# Patient Record
Sex: Female | Born: 1960 | Race: White | Hispanic: No | Marital: Married | Smoking: Former smoker
Health system: Southern US, Community
[De-identification: ages and names within clinical notes are randomized; demographics above are authoritative.]

## PROBLEM LIST (undated history)

## (undated) DIAGNOSIS — Z9889 Other specified postprocedural states: Secondary | ICD-10-CM

## (undated) DIAGNOSIS — E785 Hyperlipidemia, unspecified: Secondary | ICD-10-CM

## (undated) DIAGNOSIS — E042 Nontoxic multinodular goiter: Secondary | ICD-10-CM

## (undated) DIAGNOSIS — R4586 Emotional lability: Secondary | ICD-10-CM

## (undated) DIAGNOSIS — R112 Nausea with vomiting, unspecified: Secondary | ICD-10-CM

## (undated) DIAGNOSIS — F32A Depression, unspecified: Secondary | ICD-10-CM

## (undated) DIAGNOSIS — M1990 Unspecified osteoarthritis, unspecified site: Secondary | ICD-10-CM

## (undated) DIAGNOSIS — G4730 Sleep apnea, unspecified: Secondary | ICD-10-CM

## (undated) DIAGNOSIS — Z974 Presence of external hearing-aid: Secondary | ICD-10-CM

## (undated) DIAGNOSIS — K219 Gastro-esophageal reflux disease without esophagitis: Secondary | ICD-10-CM

## (undated) DIAGNOSIS — C801 Malignant (primary) neoplasm, unspecified: Secondary | ICD-10-CM

## (undated) HISTORY — PX: ABDOMINAL HYSTERECTOMY: SHX81

## (undated) HISTORY — PX: BUNIONECTOMY: SHX129

## (undated) HISTORY — PX: BLADDER SUSPENSION: SHX72

## (undated) HISTORY — PX: COLONOSCOPY WITH ESOPHAGOGASTRODUODENOSCOPY (EGD): SHX5779

---
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
## 2005-01-12 ENCOUNTER — Inpatient Hospital Stay: Payer: Self-pay | Admitting: Urology

## 2005-03-16 ENCOUNTER — Ambulatory Visit: Payer: Self-pay | Admitting: Family Medicine

## 2007-01-06 ENCOUNTER — Ambulatory Visit: Payer: Self-pay

## 2007-02-07 ENCOUNTER — Ambulatory Visit: Payer: Self-pay | Admitting: Family Medicine

## 2008-02-28 ENCOUNTER — Ambulatory Visit: Payer: Self-pay | Admitting: Family Medicine

## 2009-04-23 ENCOUNTER — Ambulatory Visit: Payer: Self-pay | Admitting: Family Medicine

## 2010-09-02 ENCOUNTER — Ambulatory Visit: Payer: Self-pay | Admitting: Family Medicine

## 2011-09-21 ENCOUNTER — Ambulatory Visit: Payer: Self-pay | Admitting: Family Medicine

## 2011-10-25 ENCOUNTER — Ambulatory Visit: Payer: Self-pay | Admitting: Gastroenterology

## 2011-10-26 LAB — PATHOLOGY REPORT

## 2012-11-22 ENCOUNTER — Ambulatory Visit: Payer: Self-pay | Admitting: Family Medicine

## 2014-01-10 ENCOUNTER — Ambulatory Visit: Payer: Self-pay | Admitting: Family Medicine

## 2015-01-14 ENCOUNTER — Ambulatory Visit
Admit: 2015-01-14 | Disposition: A | Discharge status: Home / Self Care | Payer: Self-pay | Attending: Family Medicine | Admitting: Family Medicine

## 2016-01-27 ENCOUNTER — Other Ambulatory Visit: Payer: Self-pay | Admitting: Family Medicine

## 2016-01-27 DIAGNOSIS — Z1231 Encounter for screening mammogram for malignant neoplasm of breast: Secondary | ICD-10-CM

## 2016-02-03 ENCOUNTER — Ambulatory Visit
Admission: RE | Admit: 2016-02-03 | Discharge: 2016-02-03 | Disposition: A | Discharge status: Home / Self Care | Payer: PRIVATE HEALTH INSURANCE | Source: Ambulatory Visit | Attending: Physician Assistant | Admitting: Physician Assistant

## 2016-02-03 ENCOUNTER — Other Ambulatory Visit: Payer: Self-pay | Admitting: Physician Assistant

## 2016-02-03 DIAGNOSIS — R1032 Left lower quadrant pain: Secondary | ICD-10-CM | POA: Insufficient documentation

## 2016-02-03 DIAGNOSIS — K449 Diaphragmatic hernia without obstruction or gangrene: Secondary | ICD-10-CM | POA: Diagnosis not present

## 2016-02-03 DIAGNOSIS — Z90710 Acquired absence of both cervix and uterus: Secondary | ICD-10-CM | POA: Diagnosis not present

## 2016-02-03 DIAGNOSIS — K7689 Other specified diseases of liver: Secondary | ICD-10-CM | POA: Diagnosis not present

## 2016-02-03 HISTORY — DX: Malignant (primary) neoplasm, unspecified: C80.1

## 2016-02-03 MED ORDER — IOPAMIDOL (ISOVUE-300) INJECTION 61%
100.0000 mL | Freq: Once | INTRAVENOUS | Status: AC | PRN
  Administered 2016-02-03: 100 mL via INTRAVENOUS

## 2016-02-13 ENCOUNTER — Ambulatory Visit: Payer: Self-pay

## 2016-04-02 ENCOUNTER — Ambulatory Visit
Admission: RE | Admit: 2016-04-02 | Discharge: 2016-04-02 | Disposition: A | Discharge status: Home / Self Care | Payer: PRIVATE HEALTH INSURANCE | Source: Ambulatory Visit | Attending: Family Medicine | Admitting: Family Medicine

## 2016-04-02 DIAGNOSIS — Z1231 Encounter for screening mammogram for malignant neoplasm of breast: Secondary | ICD-10-CM | POA: Insufficient documentation

## 2017-03-10 ENCOUNTER — Other Ambulatory Visit: Payer: Self-pay | Admitting: Student

## 2017-03-10 DIAGNOSIS — M25562 Pain in left knee: Secondary | ICD-10-CM

## 2017-03-16 ENCOUNTER — Ambulatory Visit
Admission: RE | Admit: 2017-03-16 | Discharge: 2017-03-16 | Disposition: A | Discharge status: Home / Self Care | Payer: PRIVATE HEALTH INSURANCE | Source: Ambulatory Visit | Attending: Student | Admitting: Student

## 2017-03-16 DIAGNOSIS — M25462 Effusion, left knee: Secondary | ICD-10-CM | POA: Insufficient documentation

## 2017-03-16 DIAGNOSIS — M25562 Pain in left knee: Secondary | ICD-10-CM | POA: Diagnosis present

## 2017-05-03 ENCOUNTER — Encounter: Payer: Self-pay | Admitting: *Deleted

## 2017-05-04 ENCOUNTER — Ambulatory Visit
Admission: RE | Admit: 2017-05-04 | Discharge: 2017-05-04 | Disposition: A | Discharge status: Home / Self Care | Payer: PRIVATE HEALTH INSURANCE | Source: Ambulatory Visit | Attending: Surgery | Admitting: Surgery

## 2017-05-04 ENCOUNTER — Encounter
Admission: RE | Disposition: A | Discharge status: Home / Self Care | Payer: Self-pay | Source: Ambulatory Visit | Attending: Surgery

## 2017-05-04 ENCOUNTER — Ambulatory Visit: Payer: PRIVATE HEALTH INSURANCE | Admitting: Anesthesiology

## 2017-05-04 DIAGNOSIS — E785 Hyperlipidemia, unspecified: Secondary | ICD-10-CM | POA: Diagnosis not present

## 2017-05-04 DIAGNOSIS — F329 Major depressive disorder, single episode, unspecified: Secondary | ICD-10-CM | POA: Diagnosis not present

## 2017-05-04 DIAGNOSIS — Z79899 Other long term (current) drug therapy: Secondary | ICD-10-CM | POA: Insufficient documentation

## 2017-05-04 DIAGNOSIS — Z888 Allergy status to other drugs, medicaments and biological substances status: Secondary | ICD-10-CM | POA: Diagnosis not present

## 2017-05-04 DIAGNOSIS — Z87891 Personal history of nicotine dependence: Secondary | ICD-10-CM | POA: Insufficient documentation

## 2017-05-04 DIAGNOSIS — M23341 Other meniscus derangements, anterior horn of lateral meniscus, right knee: Secondary | ICD-10-CM | POA: Insufficient documentation

## 2017-05-04 DIAGNOSIS — M1711 Unilateral primary osteoarthritis, right knee: Secondary | ICD-10-CM | POA: Insufficient documentation

## 2017-05-04 DIAGNOSIS — M23331 Other meniscus derangements, other medial meniscus, right knee: Secondary | ICD-10-CM | POA: Insufficient documentation

## 2017-05-04 DIAGNOSIS — S83229A Peripheral tear of medial meniscus, current injury, unspecified knee, initial encounter: Secondary | ICD-10-CM | POA: Diagnosis present

## 2017-05-04 HISTORY — DX: Nausea with vomiting, unspecified: R11.2

## 2017-05-04 HISTORY — DX: Presence of external hearing-aid: Z97.4

## 2017-05-04 HISTORY — PX: KNEE ARTHROSCOPY WITH MEDIAL MENISECTOMY: SHX5651

## 2017-05-04 HISTORY — DX: Gastro-esophageal reflux disease without esophagitis: K21.9

## 2017-05-04 HISTORY — DX: Other specified postprocedural states: Z98.890

## 2017-05-04 SURGERY — ARTHROSCOPY, KNEE, WITH MEDIAL MENISCECTOMY
Anesthesia: General | Laterality: Left

## 2017-05-04 MED ORDER — CEFAZOLIN SODIUM-DEXTROSE 2-4 GM/100ML-% IV SOLN
2.0000 g | Freq: Once | INTRAVENOUS | Status: AC
  Administered 2017-05-04: 2 g via INTRAVENOUS

## 2017-05-04 MED ORDER — MIDAZOLAM HCL 5 MG/5ML IJ SOLN
INTRAMUSCULAR | Status: DC | PRN
  Administered 2017-05-04: 2 mg via INTRAVENOUS

## 2017-05-04 MED ORDER — OXYCODONE HCL 5 MG PO TABS: 5.0000 mg | ORAL_TABLET | Freq: Once | ORAL | Status: DC | PRN

## 2017-05-04 MED ORDER — ONDANSETRON HCL 4 MG/2ML IJ SOLN
INTRAMUSCULAR | Status: DC | PRN
  Administered 2017-05-04: 4 mg via INTRAVENOUS

## 2017-05-04 MED ORDER — FENTANYL CITRATE (PF) 100 MCG/2ML IJ SOLN: 25.0000 ug | INTRAMUSCULAR | Status: DC | PRN

## 2017-05-04 MED ORDER — HYDROCODONE-ACETAMINOPHEN 5-325 MG PO TABS: 1.0000 | ORAL_TABLET | Freq: Four times a day (QID) | ORAL | 0 refills | Status: DC | PRN

## 2017-05-04 MED ORDER — DEXAMETHASONE SODIUM PHOSPHATE 4 MG/ML IJ SOLN
INTRAMUSCULAR | Status: DC | PRN
  Administered 2017-05-04: 4 mg via INTRAVENOUS

## 2017-05-04 MED ORDER — FENTANYL CITRATE (PF) 100 MCG/2ML IJ SOLN
INTRAMUSCULAR | Status: DC | PRN
  Administered 2017-05-04: 50 ug via INTRAVENOUS

## 2017-05-04 MED ORDER — LIDOCAINE-EPINEPHRINE 1 %-1:100000 IJ SOLN
INTRAMUSCULAR | Status: DC | PRN
  Administered 2017-05-04: 20 mL
  Administered 2017-05-04: 30 mL

## 2017-05-04 MED ORDER — LACTATED RINGERS IV SOLN: INTRAVENOUS | Status: DC

## 2017-05-04 MED ORDER — LACTATED RINGERS IV SOLN
INTRAVENOUS | Status: DC
  Administered 2017-05-04: 08:00:00 via INTRAVENOUS

## 2017-05-04 MED ORDER — OXYCODONE HCL 5 MG/5ML PO SOLN: 5.0000 mg | Freq: Once | ORAL | Status: DC | PRN

## 2017-05-04 MED ORDER — ONDANSETRON HCL 4 MG/2ML IJ SOLN: 4.0000 mg | Freq: Once | INTRAMUSCULAR | Status: DC | PRN

## 2017-05-04 MED ORDER — METOCLOPRAMIDE HCL 5 MG PO TABS: 5.0000 mg | ORAL_TABLET | Freq: Three times a day (TID) | ORAL | Status: DC | PRN

## 2017-05-04 MED ORDER — LIDOCAINE HCL (CARDIAC) 20 MG/ML IV SOLN
INTRAVENOUS | Status: DC | PRN
  Administered 2017-05-04: 50 mg via INTRATRACHEAL

## 2017-05-04 MED ORDER — ONDANSETRON HCL 4 MG/2ML IJ SOLN: 4.0000 mg | Freq: Four times a day (QID) | INTRAMUSCULAR | Status: DC | PRN

## 2017-05-04 MED ORDER — METOCLOPRAMIDE HCL 5 MG/ML IJ SOLN: 5.0000 mg | Freq: Three times a day (TID) | INTRAMUSCULAR | Status: DC | PRN

## 2017-05-04 MED ORDER — POTASSIUM CHLORIDE IN NACL 20-0.9 MEQ/L-% IV SOLN: INTRAVENOUS | Status: DC

## 2017-05-04 MED ORDER — PROPOFOL 10 MG/ML IV BOLUS
INTRAVENOUS | Status: DC | PRN
  Administered 2017-05-04: 150 mg via INTRAVENOUS

## 2017-05-04 MED ORDER — ACETAMINOPHEN 10 MG/ML IV SOLN: 1000.0000 mg | Freq: Once | INTRAVENOUS | Status: DC | PRN

## 2017-05-04 MED ORDER — EPHEDRINE SULFATE 50 MG/ML IJ SOLN
INTRAMUSCULAR | Status: DC | PRN
  Administered 2017-05-04: 10 mg via INTRAVENOUS
  Administered 2017-05-04: 5 mg via INTRAVENOUS

## 2017-05-04 MED ORDER — GLYCOPYRROLATE 0.2 MG/ML IJ SOLN
INTRAMUSCULAR | Status: DC | PRN
  Administered 2017-05-04: 0.1 mg via INTRAVENOUS

## 2017-05-04 MED ORDER — ONDANSETRON HCL 4 MG PO TABS: 4.0000 mg | ORAL_TABLET | Freq: Four times a day (QID) | ORAL | Status: DC | PRN

## 2017-05-04 MED ORDER — ROPIVACAINE HCL 5 MG/ML IJ SOLN
INTRAMUSCULAR | Status: DC | PRN
  Administered 2017-05-04: 30 mL

## 2017-05-04 MED ORDER — HYDROCODONE-ACETAMINOPHEN 5-325 MG PO TABS: 1.0000 | ORAL_TABLET | ORAL | Status: DC | PRN

## 2017-05-04 SURGICAL SUPPLY — 33 items
BANDAGE ELASTIC 6 LF NS (GAUZE/BANDAGES/DRESSINGS) ×2 IMPLANT
BLADE FULL RADIUS 3.5 (BLADE) ×2 IMPLANT
BUR ACROMIONIZER 4.0 (BURR) IMPLANT
CHLORAPREP W/TINT 26ML (MISCELLANEOUS) ×2 IMPLANT
COVER LIGHT HANDLE UNIVERSAL (MISCELLANEOUS) ×4 IMPLANT
CUFF TOURN SGL QUICK 30 (MISCELLANEOUS)
CUFF TOURN SGL QUICK 34 (TOURNIQUET CUFF) ×1
CUFF TRNQT CYL 34X4X40X1 (TOURNIQUET CUFF) ×1 IMPLANT
CUFF TRNQT CYL LO 30X4X (MISCELLANEOUS) IMPLANT
DRAPE IMP U-DRAPE 54X76 (DRAPES) ×2 IMPLANT
FASTFIX NDL DEL SYS 360 CVD (Miscellaneous) ×4 IMPLANT
GAUZE SPONGE 4X4 12PLY STRL (GAUZE/BANDAGES/DRESSINGS) ×2 IMPLANT
GLOVE BIO SURGEON STRL SZ8 (GLOVE) ×4 IMPLANT
GLOVE INDICATOR 8.0 STRL GRN (GLOVE) ×2 IMPLANT
GOWN STRL REUS W/ TWL LRG LVL3 (GOWN DISPOSABLE) ×1 IMPLANT
GOWN STRL REUS W/ TWL XL LVL3 (GOWN DISPOSABLE) ×1 IMPLANT
GOWN STRL REUS W/TWL LRG LVL3 (GOWN DISPOSABLE) ×1
GOWN STRL REUS W/TWL XL LVL3 (GOWN DISPOSABLE) ×1
IV LACTATED RINGER IRRG 3000ML (IV SOLUTION) ×1
IV LR IRRIG 3000ML ARTHROMATIC (IV SOLUTION) ×1 IMPLANT
KIT ROOM TURNOVER OR (KITS) ×2 IMPLANT
MANIFOLD 4PT FOR NEPTUNE1 (MISCELLANEOUS) ×2 IMPLANT
NEEDLE HYPO 21X1.5 SAFETY (NEEDLE) ×4 IMPLANT
PACK ARTHROSCOPY KNEE (MISCELLANEOUS) ×2 IMPLANT
PUSHER KNOT ARTHRO STRT FASTFI (MISCELLANEOUS) ×2 IMPLANT
STRAP BODY AND KNEE 60X3 (MISCELLANEOUS) ×2 IMPLANT
SUT PROLENE 4 0 PS 2 18 (SUTURE) ×2 IMPLANT
SUT VIC AB 2-0 CT1 27 (SUTURE)
SUT VIC AB 2-0 CT1 TAPERPNT 27 (SUTURE) IMPLANT
SYR 50ML LL SCALE MARK (SYRINGE) ×2 IMPLANT
SYSTEM NDL DEL FSTFX  360 CVD (Miscellaneous) ×2 IMPLANT
TUBING ARTHRO INFLOW-ONLY STRL (TUBING) ×2 IMPLANT
WAND HAND CNTRL MULTIVAC 90 (MISCELLANEOUS) IMPLANT

## 2017-05-04 NOTE — Transfer of Care (Signed)
Immediate Anesthesia Transfer of Care Note  Patient: Isabella Luna  Procedure(s) Performed: Procedure(s): KNEE ARTHROSCOPY WITH DEBRIDEMENT AND PARTIAL MEDIAL MENISECTOMY (Left)  Patient Location: PACU  Anesthesia Type: General  Level of Consciousness: awake, alert  and patient cooperative  Airway and Oxygen Therapy: Patient Spontanous Breathing and Patient connected to supplemental oxygen  Post-op Assessment: Post-op Vital signs reviewed, Patient's Cardiovascular Status Stable, Respiratory Function Stable, Patent Airway and No signs of Nausea or vomiting  Post-op Vital Signs: Reviewed and stable  Complications: No apparent anesthesia complications

## 2017-05-04 NOTE — Anesthesia Postprocedure Evaluation (Signed)
Anesthesia Post Note  Patient: Isabella Luna  Procedure(s) Performed: Procedure(s) (LRB): Arthroscopic medial meniscus repair and arthroscopic partial lateral meniscectomy, right knee.  (Left)  Patient location during evaluation: PACU Anesthesia Type: General Level of consciousness: awake and alert, oriented and patient cooperative Pain management: pain level controlled Vital Signs Assessment: post-procedure vital signs reviewed and stable Respiratory status: spontaneous breathing, nonlabored ventilation and respiratory function stable Cardiovascular status: blood pressure returned to baseline and stable Postop Assessment: adequate PO intake Anesthetic complications: no    Darrin Nipper

## 2017-05-04 NOTE — H&P (Signed)
Paper H&P to be scanned into permanent record. H&P reviewed and patient re-examined. No changes. 

## 2017-05-04 NOTE — Anesthesia Preprocedure Evaluation (Signed)
Anesthesia Evaluation  Patient identified by MRN, date of birth, ID band Patient awake    Reviewed: Allergy & Precautions, NPO status , Patient's Chart, lab work & pertinent test results  History of Anesthesia Complications (+) PONV and history of anesthetic complications  Airway Mallampati: IV  TM Distance: >3 FB Neck ROM: Full    Dental no notable dental hx. (+) Teeth Intact   Pulmonary former smoker (quit 1993),  Snoring    Pulmonary exam normal breath sounds clear to auscultation       Cardiovascular Exercise Tolerance: Good negative cardio ROS Normal cardiovascular exam Rhythm:Regular Rate:Normal     Neuro/Psych negative neurological ROS     GI/Hepatic GERD  Controlled,  Endo/Other  negative endocrine ROS  Renal/GU negative Renal ROS     Musculoskeletal   Abdominal   Peds  Hematology negative hematology ROS (+)   Anesthesia Other Findings   Reproductive/Obstetrics                             Anesthesia Physical Anesthesia Plan  ASA: II  Anesthesia Plan: General   Post-op Pain Management:    Induction: Intravenous  PONV Risk Score and Plan: 2 and Ondansetron and Dexamethasone  Airway Management Planned: LMA  Additional Equipment:   Intra-op Plan:   Post-operative Plan: Extubation in OR  Informed Consent: I have reviewed the patients History and Physical, chart, labs and discussed the procedure including the risks, benefits and alternatives for the proposed anesthesia with the patient or authorized representative who has indicated his/her understanding and acceptance.     Plan Discussed with: CRNA  Anesthesia Plan Comments:         Anesthesia Quick Evaluation

## 2017-05-04 NOTE — Discharge Instructions (Signed)
General Anesthesia, Adult, Care After °These instructions provide you with information about caring for yourself after your procedure. Your health care provider may also give you more specific instructions. Your treatment has been planned according to current medical practices, but problems sometimes occur. Call your health care provider if you have any problems or questions after your procedure. °What can I expect after the procedure? °After the procedure, it is common to have: °· Vomiting. °· A sore throat. °· Mental slowness. ° °It is common to feel: °· Nauseous. °· Cold or shivery. °· Sleepy. °· Tired. °· Sore or achy, even in parts of your body where you did not have surgery. ° °Follow these instructions at home: °For at least 24 hours after the procedure: °· Do not: °? Participate in activities where you could fall or become injured. °? Drive. °? Use heavy machinery. °? Drink alcohol. °? Take sleeping pills or medicines that cause drowsiness. °? Make important decisions or sign legal documents. °? Take care of children on your own. °· Rest. °Eating and drinking °· If you vomit, drink water, juice, or soup when you can drink without vomiting. °· Drink enough fluid to keep your urine clear or pale yellow. °· Make sure you have little or no nausea before eating solid foods. °· Follow the diet recommended by your health care provider. °General instructions °· Have a responsible adult stay with you until you are awake and alert. °· Return to your normal activities as told by your health care provider. Ask your health care provider what activities are safe for you. °· Take over-the-counter and prescription medicines only as told by your health care provider. °· If you smoke, do not smoke without supervision. °· Keep all follow-up visits as told by your health care provider. This is important. °Contact a health care provider if: °· You continue to have nausea or vomiting at home, and medicines are not helpful. °· You  cannot drink fluids or start eating again. °· You cannot urinate after 8-12 hours. °· You develop a skin rash. °· You have fever. °· You have increasing redness at the site of your procedure. °Get help right away if: °· You have difficulty breathing. °· You have chest pain. °· You have unexpected bleeding. °· You feel that you are having a life-threatening or urgent problem. °This information is not intended to replace advice given to you by your health care provider. Make sure you discuss any questions you have with your health care provider. °Document Released: 12/27/2000 Document Revised: 02/23/2016 Document Reviewed: 09/04/2015 °Elsevier Interactive Patient Education © 2018 [employer] ° ° °Keep dressing dry and intact.  °May shower after dressing changed on post-op day #4 (Sunday).  °Cover sutures with Band-Aids after drying off. °Apply ice frequently to knee. °Take ibuprofen 600-800 mg TID with meals for 7-10 days, then as necessary. °Take pain medication as prescribed or ES Tylenol when needed.  °May weight-bear as tolerated - use crutches or walker as needed. °Follow-up in 10-14 days or as scheduled. °

## 2017-05-04 NOTE — Op Note (Signed)
05/04/2017  10:01 AM  Patient:   Isabella Luna  Pre-Op Diagnosis:   Peripheral tear of medial meniscus, right knee.  Postoperative diagnosis:   Peripheral tear of medial meniscus, degenerative tear of the anterior horn of lateral meniscus, and degenerative joint disease, right knee.  Procedure:   Arthroscopic medial meniscus repair and arthroscopic partial lateral meniscectomy, right knee.  Surgeon:   Pascal Lux, M.D.  Anesthesia:   General LMA.  Findings:   As above. There were grade 3 chondromalacial changes involving the femoral trochlea and grade 1-2 chondromalacia changes involving the patella. The remaining articular surfaces were in satisfactory condition, as were the anterior and posterior cruciate ligaments.  Complications:   None.  EBL:   <5 cc.  Total fluids:   850 cc of crystalloid.  Tourniquet time:   None  Drains:   None  Closure:   4-0 Prolene interrupted sutures.  Brief clinical note:   The patient is  a 56 year old female with a several month history of medial sided right knee pain. Her symptoms have persisted despite medications, activity modification, a steroid injection, etc. Her history and examination are consistent with a medial meniscus tear which was confirmed by MRI scan. The patient presents at this time for arthroscopy, debridement, and repair versus partial medial meniscectomy.  Procedure:   The patient was brought into the operating room and lain in the supine position. After adequate general laryngeal mask anesthesia was obtained, a timeout was performed to verify the appropriate side. The patient's left knee was injected sterilely using a solution of 30 cc of 1% lidocaine and 30 cc of 0.5% Sensorcaine with epinephrine. The left lower extremity was prepped with ChloraPrep solution before being draped sterilely. Preoperative antibiotics were administered. The expected portal sites were injected with 0.5% Sensorcaine with epinephrine before the camera  was placed in the anterolateral portal and instrumentation performed through the anteromedial portal. The knee was sequentially examined beginning in the suprapatellar pouch, then progressing to the patellofemoral space, the medial gutter compartment, the notch, and finally the lateral compartment and gutter. The findings were as described above. Abundant reactive synovial tissues anteriorly were debrided using the full-radius resector in order to improve visualization.  The degenerative tear involving the anterior portion of the lateral meniscus was debrided back to stable margins using the full-radius resector. Medially, the peripheral tear involving the posterior portion of the medial meniscus demonstrated resulted in some instability of the posterior portion. This area was repaired using two [employer] FasT-Fix 360 anchors. Subsequent probing of the repair demonstrated excellent stability. The instruments were removed from the joint after suctioning the excess fluid. The portal sites were closed using 4-0 Prolene interrupted sutures before a sterile bulky dressing was applied to the knee. The patient was then awakened, extubated, and returned to the recovery room in satisfactory condition after tolerating the procedure well.

## 2017-05-04 NOTE — Anesthesia Procedure Notes (Signed)
Procedure Name: LMA Insertion Date/Time: 05/04/2017 9:13 AM Performed by: Mayme Genta Pre-anesthesia Checklist: Patient identified, Emergency Drugs available, Suction available, Timeout performed and Patient being monitored Patient Re-evaluated:Patient Re-evaluated prior to induction Oxygen Delivery Method: Circle system utilized Preoxygenation: Pre-oxygenation with 100% oxygen Induction Type: IV induction LMA: LMA inserted LMA Size: 4.0 Number of attempts: 1 Placement Confirmation: positive ETCO2 and breath sounds checked- equal and bilateral Tube secured with: Tape

## 2017-05-05 ENCOUNTER — Encounter: Payer: Self-pay | Admitting: Surgery

## 2017-05-09 ENCOUNTER — Encounter: Payer: Self-pay | Admitting: Surgery

## 2018-02-22 ENCOUNTER — Encounter
Admission: RE | Admit: 2018-02-22 | Discharge: 2018-02-22 | Disposition: A | Discharge status: Home / Self Care | Payer: PRIVATE HEALTH INSURANCE | Source: Ambulatory Visit | Attending: Surgery | Admitting: Surgery

## 2018-02-22 ENCOUNTER — Ambulatory Visit
Admission: RE | Admit: 2018-02-22 | Discharge: 2018-02-22 | Disposition: A | Discharge status: Home / Self Care | Payer: PRIVATE HEALTH INSURANCE | Source: Ambulatory Visit | Attending: Surgery | Admitting: Surgery

## 2018-02-22 ENCOUNTER — Other Ambulatory Visit: Payer: Self-pay

## 2018-02-22 DIAGNOSIS — Z01810 Encounter for preprocedural cardiovascular examination: Secondary | ICD-10-CM | POA: Diagnosis present

## 2018-02-22 DIAGNOSIS — M1712 Unilateral primary osteoarthritis, left knee: Secondary | ICD-10-CM | POA: Insufficient documentation

## 2018-02-22 DIAGNOSIS — Z85828 Personal history of other malignant neoplasm of skin: Secondary | ICD-10-CM | POA: Insufficient documentation

## 2018-02-22 DIAGNOSIS — J984 Other disorders of lung: Secondary | ICD-10-CM | POA: Insufficient documentation

## 2018-02-22 DIAGNOSIS — Z01818 Encounter for other preprocedural examination: Secondary | ICD-10-CM | POA: Insufficient documentation

## 2018-02-22 DIAGNOSIS — K219 Gastro-esophageal reflux disease without esophagitis: Secondary | ICD-10-CM | POA: Insufficient documentation

## 2018-02-22 DIAGNOSIS — R001 Bradycardia, unspecified: Secondary | ICD-10-CM | POA: Insufficient documentation

## 2018-02-22 DIAGNOSIS — Z01812 Encounter for preprocedural laboratory examination: Secondary | ICD-10-CM | POA: Diagnosis present

## 2018-02-22 HISTORY — DX: Unspecified osteoarthritis, unspecified site: M19.90

## 2018-02-22 HISTORY — DX: Emotional lability: R45.86

## 2018-02-22 LAB — BASIC METABOLIC PANEL
ANION GAP: 5 (ref 5–15)
BUN: 10 mg/dL (ref 6–20)
CALCIUM: 8.7 mg/dL — AB (ref 8.9–10.3)
CO2: 27 mmol/L (ref 22–32)
Chloride: 105 mmol/L (ref 101–111)
Creatinine, Ser: 0.64 mg/dL (ref 0.44–1.00)
GFR calc Af Amer: >60 mL/min (ref >60)
GLUCOSE: 99 mg/dL (ref 65–99)
POTASSIUM: 3.5 mmol/L (ref 3.5–5.1)
Sodium: 137 mmol/L (ref 135–145)

## 2018-02-22 LAB — CBC
HCT: 44.5 % (ref 35.0–47.0)
Hemoglobin: 15.2 g/dL (ref 12.0–16.0)
MCH: 28.9 pg (ref 26.0–34.0)
MCHC: 34.1 g/dL (ref 32.0–36.0)
MCV: 84.9 fL (ref 80.0–100.0)
PLATELETS: 239 10*3/uL (ref 150–440)
RBC: 5.25 MIL/uL — AB (ref 3.80–5.20)
RDW: 12.4 % (ref 11.5–14.5)
WBC: 6.3 10*3/uL (ref 3.6–11.0)

## 2018-02-22 LAB — URINALYSIS, ROUTINE W REFLEX MICROSCOPIC
BACTERIA UA: NONE SEEN
Bilirubin Urine: NEGATIVE
GLUCOSE, UA: NEGATIVE mg/dL
KETONES UR: NEGATIVE mg/dL
Leukocytes, UA: NEGATIVE
NITRITE: NEGATIVE
PROTEIN: NEGATIVE mg/dL
Specific Gravity, Urine: 1.01 (ref 1.005–1.030)
pH: 5 (ref 5.0–8.0)

## 2018-02-22 LAB — PROTIME-INR
INR: 0.93
Prothrombin Time: 12.4 seconds (ref 11.4–15.2)

## 2018-02-22 LAB — TYPE AND SCREEN
ABO/RH(D): A NEG
Antibody Screen: NEGATIVE

## 2018-02-22 LAB — SURGICAL PCR SCREEN
MRSA, PCR: NEGATIVE
STAPHYLOCOCCUS AUREUS: NEGATIVE

## 2018-02-22 NOTE — Patient Instructions (Signed)
Your procedure is scheduled on: 03/07/18 Report to Day Surgery.[facility]OND FLOOR To find out your arrival time please call [phone] between 1PM - 3PM on  03/06/18.  Remember: Instructions that are not followed completely may result in serious medical risk, up to and including death, or upon the discretion of your surgeon and anesthesiologist your surgery may need to be rescheduled.     _X__ 1. Do not eat food after midnight the night before your procedure.                 No gum chewing or hard candies. You may drink clear liquids up to 2 hours                 before you are scheduled to arrive for your surgery- DO not drink clear                 liquids within 2 hours of the start of your surgery.                 Clear Liquids include:  water, apple juice without pulp, clear carbohydrate                 drink such as Clearfast of Gartorade, Black Coffee or Tea (Do not add                 anything to coffee or tea).  __X__2.  On the morning of surgery brush your teeth with toothpaste and water, you                 may rinse your mouth with mouthwash if you wish.  Do not swallow any              toothpaste of mouthwash.     _X__ 3.  No Alcohol for 24 hours before or after surgery.   _X__ 4.  Do Not Smoke or use e-cigarettes For 24 Hours Prior to Your Surgery.                 Do not use any chewable tobacco products for at least 6 hours prior to                 surgery.  ____  5.  Bring all medications with you on the day of surgery if instructed.   _X___  6.  Notify your doctor if there is any change in your medical condition      (cold, fever, infections).     Do not wear jewelry, make-up, hairpins, clips or nail polish. Do not wear lotions, powders, or perfumes. You may wear deodorant. Do not shave 48 hours prior to surgery. Men may shave face and neck. Do not bring valuables to the hospital.    [facility] is not responsible for any belongings or  valuables.  Contacts, dentures or bridgework may not be worn into surgery. Leave your suitcase in the car. After surgery it may be brought to your room. For patients admitted to the hospital, discharge time is determined by your treatment team.   Patients discharged the day of surgery will not be allowed to drive home.   Please read over the following fact sheets that you were given:   Surgical Site Infection Prevention / MRSA  _X___ Take these medicines the morning of surgery with A SIP OF WATER:    1. CITALOPRAM  2.   3.   4.  5.  6.  ____  Fleet Enema (as directed)   _X___ Use CHG Soap as directed  ____ Use inhalers on the day of surgery  ____ Stop metformin 2 days prior to surgery    ____ Take 1/2 of usual insulin dose the night before surgery. No insulin the morning          of surgery.   ____ Stop Coumadin/Plavix/aspirin on   __X__ Stop Anti-inflammatories on    02/22/18 UNTIL AFTER SURGERY  ____ Stop supplements until after surgery.    ____ Bring C-Pap to the hospital.

## 2018-03-06 MED ORDER — CEFAZOLIN SODIUM-DEXTROSE 2-4 GM/100ML-% IV SOLN
2.0000 g | Freq: Once | INTRAVENOUS | Status: AC
  Administered 2018-03-07: 2 g via INTRAVENOUS

## 2018-03-07 ENCOUNTER — Inpatient Hospital Stay: Payer: PRIVATE HEALTH INSURANCE | Admitting: Certified Registered Nurse Anesthetist

## 2018-03-07 ENCOUNTER — Other Ambulatory Visit: Payer: Self-pay

## 2018-03-07 ENCOUNTER — Inpatient Hospital Stay
Admission: RE | Admit: 2018-03-07 | Discharge: 2018-03-09 | DRG: 470 | Disposition: A | Discharge status: Home / Self Care | Payer: PRIVATE HEALTH INSURANCE | Source: Ambulatory Visit | Attending: Surgery | Admitting: Surgery

## 2018-03-07 ENCOUNTER — Encounter: Payer: Self-pay | Admitting: *Deleted

## 2018-03-07 ENCOUNTER — Encounter
Admission: RE | Disposition: A | Discharge status: Home / Self Care | Payer: Self-pay | Source: Ambulatory Visit | Attending: Surgery

## 2018-03-07 ENCOUNTER — Inpatient Hospital Stay: Payer: PRIVATE HEALTH INSURANCE

## 2018-03-07 DIAGNOSIS — Z87891 Personal history of nicotine dependence: Secondary | ICD-10-CM | POA: Diagnosis not present

## 2018-03-07 DIAGNOSIS — E785 Hyperlipidemia, unspecified: Secondary | ICD-10-CM | POA: Diagnosis present

## 2018-03-07 DIAGNOSIS — F329 Major depressive disorder, single episode, unspecified: Secondary | ICD-10-CM | POA: Diagnosis present

## 2018-03-07 DIAGNOSIS — K219 Gastro-esophageal reflux disease without esophagitis: Secondary | ICD-10-CM | POA: Diagnosis present

## 2018-03-07 DIAGNOSIS — M1712 Unilateral primary osteoarthritis, left knee: Principal | ICD-10-CM | POA: Diagnosis present

## 2018-03-07 DIAGNOSIS — Z9189 Other specified personal risk factors, not elsewhere classified: Secondary | ICD-10-CM

## 2018-03-07 DIAGNOSIS — Z96652 Presence of left artificial knee joint: Secondary | ICD-10-CM

## 2018-03-07 DIAGNOSIS — Z888 Allergy status to other drugs, medicaments and biological substances status: Secondary | ICD-10-CM

## 2018-03-07 DIAGNOSIS — Z79899 Other long term (current) drug therapy: Secondary | ICD-10-CM

## 2018-03-07 HISTORY — PX: TOTAL KNEE ARTHROPLASTY: SHX125

## 2018-03-07 LAB — ABO/RH: ABO/RH(D): A NEG

## 2018-03-07 SURGERY — ARTHROPLASTY, KNEE, TOTAL
Anesthesia: Spinal | Site: Knee | Laterality: Left | Wound class: Clean

## 2018-03-07 MED ORDER — BUPIVACAINE-EPINEPHRINE (PF) 0.5% -1:200000 IJ SOLN
INTRAMUSCULAR | Status: DC | PRN
  Administered 2018-03-07: 30 mL via PERINEURAL

## 2018-03-07 MED ORDER — DIPHENHYDRAMINE HCL 12.5 MG/5ML PO ELIX: 12.5000 mg | ORAL_SOLUTION | ORAL | Status: DC | PRN

## 2018-03-07 MED ORDER — SODIUM CHLORIDE 0.9 % IV SOLN
INTRAVENOUS | Status: DC | PRN
  Administered 2018-03-07: 30 ug/min via INTRAVENOUS

## 2018-03-07 MED ORDER — CEFAZOLIN SODIUM-DEXTROSE 2-4 GM/100ML-% IV SOLN
INTRAVENOUS | Status: AC
  Filled 2018-03-07: qty 100

## 2018-03-07 MED ORDER — FLEET ENEMA 7-19 GM/118ML RE ENEM: 1.0000 | ENEMA | Freq: Once | RECTAL | Status: DC | PRN

## 2018-03-07 MED ORDER — ONDANSETRON HCL 4 MG PO TABS: 4.0000 mg | ORAL_TABLET | Freq: Four times a day (QID) | ORAL | Status: DC | PRN

## 2018-03-07 MED ORDER — METOCLOPRAMIDE HCL 10 MG PO TABS: 5.0000 mg | ORAL_TABLET | Freq: Three times a day (TID) | ORAL | Status: DC | PRN

## 2018-03-07 MED ORDER — PANTOPRAZOLE SODIUM 40 MG PO TBEC
40.0000 mg | DELAYED_RELEASE_TABLET | Freq: Every day | ORAL | Status: DC
  Administered 2018-03-08 – 2018-03-09 (×2): 40 mg via ORAL
  Filled 2018-03-07 (×2): qty 1

## 2018-03-07 MED ORDER — BUPIVACAINE HCL (PF) 0.5 % IJ SOLN
INTRAMUSCULAR | Status: DC | PRN
  Administered 2018-03-07: 3 mL

## 2018-03-07 MED ORDER — OXYCODONE HCL 5 MG PO TABS: 5.0000 mg | ORAL_TABLET | Freq: Once | ORAL | Status: DC | PRN

## 2018-03-07 MED ORDER — LIDOCAINE HCL (PF) 2 % IJ SOLN
INTRAMUSCULAR | Status: AC
  Filled 2018-03-07: qty 10

## 2018-03-07 MED ORDER — ACETAMINOPHEN 500 MG PO TABS
1000.0000 mg | ORAL_TABLET | Freq: Four times a day (QID) | ORAL | Status: AC
  Administered 2018-03-07 – 2018-03-08 (×4): 1000 mg via ORAL
  Filled 2018-03-07 (×4): qty 2

## 2018-03-07 MED ORDER — SCOPOLAMINE 1 MG/3DAYS TD PT72
1.0000 | MEDICATED_PATCH | Freq: Once | TRANSDERMAL | Status: DC
  Administered 2018-03-07: 1.5 mg via TRANSDERMAL

## 2018-03-07 MED ORDER — TRANEXAMIC ACID 1000 MG/10ML IV SOLN
INTRAVENOUS | Status: AC
  Filled 2018-03-07: qty 10

## 2018-03-07 MED ORDER — KETOROLAC TROMETHAMINE 30 MG/ML IJ SOLN
30.0000 mg | Freq: Once | INTRAMUSCULAR | Status: AC
  Administered 2018-03-07: 30 mg via INTRAVENOUS

## 2018-03-07 MED ORDER — SODIUM CHLORIDE 0.9 % IV SOLN
INTRAVENOUS | Status: DC | PRN
  Administered 2018-03-07: 60 mL

## 2018-03-07 MED ORDER — GLYCOPYRROLATE 0.2 MG/ML IJ SOLN
INTRAMUSCULAR | Status: AC
  Filled 2018-03-07: qty 1

## 2018-03-07 MED ORDER — BUPIVACAINE HCL (PF) 0.5 % IJ SOLN
INTRAMUSCULAR | Status: AC
  Filled 2018-03-07: qty 10

## 2018-03-07 MED ORDER — PROPOFOL 500 MG/50ML IV EMUL
INTRAVENOUS | Status: AC
  Filled 2018-03-07: qty 50

## 2018-03-07 MED ORDER — ACETAMINOPHEN 325 MG PO TABS
325.0000 mg | ORAL_TABLET | Freq: Four times a day (QID) | ORAL | Status: DC | PRN
  Administered 2018-03-09 (×2): 650 mg via ORAL
  Filled 2018-03-07 (×2): qty 2

## 2018-03-07 MED ORDER — BISACODYL 10 MG RE SUPP: 10.0000 mg | Freq: Every day | RECTAL | Status: DC | PRN

## 2018-03-07 MED ORDER — FENTANYL CITRATE (PF) 100 MCG/2ML IJ SOLN
INTRAMUSCULAR | Status: DC | PRN
  Administered 2018-03-07: 50 ug via INTRAVENOUS

## 2018-03-07 MED ORDER — TRANEXAMIC ACID 1000 MG/10ML IV SOLN
INTRAVENOUS | Status: DC | PRN
  Administered 2018-03-07: 1000 mg via TOPICAL

## 2018-03-07 MED ORDER — PROPOFOL 10 MG/ML IV BOLUS
INTRAVENOUS | Status: DC | PRN
  Administered 2018-03-07 (×2): 30 mg via INTRAVENOUS

## 2018-03-07 MED ORDER — ENOXAPARIN SODIUM 40 MG/0.4ML ~~LOC~~ SOLN
40.0000 mg | SUBCUTANEOUS | Status: DC
  Administered 2018-03-08 – 2018-03-09 (×2): 40 mg via SUBCUTANEOUS
  Filled 2018-03-07 (×2): qty 0.4

## 2018-03-07 MED ORDER — KETOROLAC TROMETHAMINE 15 MG/ML IJ SOLN
15.0000 mg | Freq: Four times a day (QID) | INTRAMUSCULAR | Status: AC
  Administered 2018-03-07 – 2018-03-08 (×4): 15 mg via INTRAVENOUS
  Filled 2018-03-07 (×4): qty 1

## 2018-03-07 MED ORDER — PHENYLEPHRINE HCL 10 MG/ML IJ SOLN
INTRAMUSCULAR | Status: AC
  Filled 2018-03-07: qty 1

## 2018-03-07 MED ORDER — OXYCODONE HCL 5 MG/5ML PO SOLN: 5.0000 mg | Freq: Once | ORAL | Status: DC | PRN

## 2018-03-07 MED ORDER — NEOMYCIN-POLYMYXIN B GU 40-200000 IR SOLN
Status: AC
  Filled 2018-03-07: qty 20

## 2018-03-07 MED ORDER — KETAMINE HCL 50 MG/ML IJ SOLN
INTRAMUSCULAR | Status: DC | PRN
  Administered 2018-03-07: 10 mg via INTRAMUSCULAR
  Administered 2018-03-07: 25 mg via INTRAMUSCULAR

## 2018-03-07 MED ORDER — FENTANYL CITRATE (PF) 100 MCG/2ML IJ SOLN: 25.0000 ug | INTRAMUSCULAR | Status: DC | PRN

## 2018-03-07 MED ORDER — CEFAZOLIN SODIUM-DEXTROSE 2-4 GM/100ML-% IV SOLN
2.0000 g | Freq: Four times a day (QID) | INTRAVENOUS | Status: AC
  Administered 2018-03-07 – 2018-03-08 (×3): 2 g via INTRAVENOUS
  Filled 2018-03-07 (×3): qty 100

## 2018-03-07 MED ORDER — SCOPOLAMINE 1 MG/3DAYS TD PT72
MEDICATED_PATCH | TRANSDERMAL | Status: AC
  Filled 2018-03-07: qty 1

## 2018-03-07 MED ORDER — FENTANYL CITRATE (PF) 100 MCG/2ML IJ SOLN
INTRAMUSCULAR | Status: AC
  Filled 2018-03-07: qty 2

## 2018-03-07 MED ORDER — VALACYCLOVIR HCL 500 MG PO TABS
2000.0000 mg | ORAL_TABLET | Freq: Two times a day (BID) | ORAL | Status: DC | PRN
  Filled 2018-03-07: qty 4

## 2018-03-07 MED ORDER — KETOROLAC TROMETHAMINE 30 MG/ML IJ SOLN
INTRAMUSCULAR | Status: AC
  Administered 2018-03-07: 30 mg via INTRAVENOUS
  Filled 2018-03-07: qty 1

## 2018-03-07 MED ORDER — PSEUDOEPHEDRINE HCL 30 MG PO TABS
30.0000 mg | ORAL_TABLET | ORAL | Status: DC | PRN
Start: 2018-03-07 — End: 2018-03-09
  Filled 2018-03-07: qty 1

## 2018-03-07 MED ORDER — NEOMYCIN-POLYMYXIN B GU 40-200000 IR SOLN
Status: DC | PRN
  Administered 2018-03-07: 14 mL

## 2018-03-07 MED ORDER — METOCLOPRAMIDE HCL 5 MG/ML IJ SOLN: 5.0000 mg | Freq: Three times a day (TID) | INTRAMUSCULAR | Status: DC | PRN

## 2018-03-07 MED ORDER — ONDANSETRON HCL 4 MG/2ML IJ SOLN: 4.0000 mg | Freq: Four times a day (QID) | INTRAMUSCULAR | Status: DC | PRN

## 2018-03-07 MED ORDER — KETAMINE HCL 50 MG/ML IJ SOLN
INTRAMUSCULAR | Status: AC
  Filled 2018-03-07: qty 10

## 2018-03-07 MED ORDER — PROPOFOL 500 MG/50ML IV EMUL
INTRAVENOUS | Status: DC | PRN
  Administered 2018-03-07: 70 ug/kg/min via INTRAVENOUS

## 2018-03-07 MED ORDER — MAGNESIUM HYDROXIDE 400 MG/5ML PO SUSP
30.0000 mL | Freq: Every day | ORAL | Status: DC | PRN
  Filled 2018-03-07: qty 30

## 2018-03-07 MED ORDER — TRAMADOL HCL 50 MG PO TABS: 50.0000 mg | ORAL_TABLET | Freq: Four times a day (QID) | ORAL | Status: DC | PRN

## 2018-03-07 MED ORDER — EPHEDRINE SULFATE 50 MG/ML IJ SOLN
INTRAMUSCULAR | Status: DC | PRN
  Administered 2018-03-07: 10 mg via INTRAVENOUS
  Administered 2018-03-07: 5 mg via INTRAVENOUS
  Administered 2018-03-07: 10 mg via INTRAVENOUS

## 2018-03-07 MED ORDER — SODIUM CHLORIDE 0.9 % IJ SOLN
INTRAMUSCULAR | Status: AC
  Filled 2018-03-07: qty 50

## 2018-03-07 MED ORDER — BUPIVACAINE LIPOSOME 1.3 % IJ SUSP
INTRAMUSCULAR | Status: AC
  Filled 2018-03-07: qty 20

## 2018-03-07 MED ORDER — LACTATED RINGERS IV SOLN
INTRAVENOUS | Status: DC
  Administered 2018-03-07 (×2): via INTRAVENOUS

## 2018-03-07 MED ORDER — FAMOTIDINE 20 MG PO TABS
ORAL_TABLET | ORAL | Status: AC
  Filled 2018-03-07: qty 1

## 2018-03-07 MED ORDER — HYDROMORPHONE HCL 1 MG/ML IJ SOLN: 0.5000 mg | INTRAMUSCULAR | Status: DC | PRN

## 2018-03-07 MED ORDER — BUPIVACAINE-EPINEPHRINE (PF) 0.5% -1:200000 IJ SOLN
INTRAMUSCULAR | Status: AC
  Filled 2018-03-07: qty 30

## 2018-03-07 MED ORDER — MIDAZOLAM HCL 2 MG/2ML IJ SOLN
INTRAMUSCULAR | Status: AC
  Filled 2018-03-07: qty 2

## 2018-03-07 MED ORDER — SIMVASTATIN 20 MG PO TABS
20.0000 mg | ORAL_TABLET | Freq: Every day | ORAL | Status: DC
  Administered 2018-03-07 – 2018-03-08 (×2): 20 mg via ORAL
  Filled 2018-03-07 (×2): qty 1

## 2018-03-07 MED ORDER — DOCUSATE SODIUM 100 MG PO CAPS
100.0000 mg | ORAL_CAPSULE | Freq: Two times a day (BID) | ORAL | Status: DC
  Administered 2018-03-07 – 2018-03-09 (×3): 100 mg via ORAL
  Filled 2018-03-07 (×3): qty 1

## 2018-03-07 MED ORDER — FAMOTIDINE 20 MG PO TABS
20.0000 mg | ORAL_TABLET | Freq: Once | ORAL | Status: AC
  Administered 2018-03-07: 20 mg via ORAL

## 2018-03-07 MED ORDER — CITALOPRAM HYDROBROMIDE 20 MG PO TABS
30.0000 mg | ORAL_TABLET | Freq: Every day | ORAL | Status: DC
  Administered 2018-03-08 – 2018-03-09 (×2): 30 mg via ORAL
  Filled 2018-03-07 (×2): qty 2

## 2018-03-07 MED ORDER — OXYCODONE HCL 5 MG PO TABS
5.0000 mg | ORAL_TABLET | ORAL | Status: DC | PRN
  Administered 2018-03-07 – 2018-03-08 (×3): 5 mg via ORAL
  Filled 2018-03-07: qty 1
  Filled 2018-03-07: qty 2
  Filled 2018-03-07: qty 1

## 2018-03-07 MED ORDER — MIDAZOLAM HCL 5 MG/5ML IJ SOLN
INTRAMUSCULAR | Status: DC | PRN
  Administered 2018-03-07: 2 mg via INTRAVENOUS

## 2018-03-07 MED ORDER — SODIUM CHLORIDE FLUSH 0.9 % IV SOLN
INTRAVENOUS | Status: AC
  Filled 2018-03-07: qty 50

## 2018-03-07 MED ORDER — SODIUM CHLORIDE 0.9 % IV SOLN
INTRAVENOUS | Status: DC
  Administered 2018-03-07: 11:00:00 via INTRAVENOUS

## 2018-03-07 SURGICAL SUPPLY — 57 items
BANDAGE ELASTIC 6 LF NS (GAUZE/BANDAGES/DRESSINGS) ×3 IMPLANT
BEARING TIBIAL AS KNEE 10M 67M (Knees) ×3 IMPLANT
BLADE SAW SAG 25X90X1.19 (BLADE) ×3 IMPLANT
BLADE SURG SZ20 CARB STEEL (BLADE) ×3 IMPLANT
CANISTER SUCT 1200ML W/VALVE (MISCELLANEOUS) ×3 IMPLANT
CANISTER SUCT 3000ML PPV (MISCELLANEOUS) ×3 IMPLANT
CEMENT BONE R 1X40 (Cement) ×6 IMPLANT
CEMENT VACUUM MIXING SYSTEM (MISCELLANEOUS) ×3 IMPLANT
CHLORAPREP W/TINT 26ML (MISCELLANEOUS) ×3 IMPLANT
COOLER POLAR GLACIER W/PUMP (MISCELLANEOUS) ×3 IMPLANT
COVER MAYO STAND STRL (DRAPES) ×3 IMPLANT
CUFF TOURN 24 STER (MISCELLANEOUS) IMPLANT
CUFF TOURN 30 STER DUAL PORT (MISCELLANEOUS) ×3 IMPLANT
DRAPE IMP U-DRAPE 54X76 (DRAPES) ×3 IMPLANT
DRAPE INCISE IOBAN 66X45 STRL (DRAPES) ×3 IMPLANT
DRAPE SHEET LG 3/4 BI-LAMINATE (DRAPES) ×3 IMPLANT
DRSG OPSITE POSTOP 4X10 (GAUZE/BANDAGES/DRESSINGS) ×3 IMPLANT
DRSG OPSITE POSTOP 4X8 (GAUZE/BANDAGES/DRESSINGS) ×3 IMPLANT
ELECT CAUTERY BLADE 6.4 (BLADE) ×3 IMPLANT
ELECT REM PT RETURN 9FT ADLT (ELECTROSURGICAL) ×3
ELECTRODE REM PT RTRN 9FT ADLT (ELECTROSURGICAL) ×1 IMPLANT
FEMORAL CR LEFT 65MM (Joint) ×3 IMPLANT
GLOVE BIO SURGEON STRL SZ7.5 (GLOVE) ×12 IMPLANT
GLOVE BIO SURGEON STRL SZ8 (GLOVE) ×12 IMPLANT
GLOVE BIOGEL PI IND STRL 8 (GLOVE) ×1 IMPLANT
GLOVE BIOGEL PI INDICATOR 8 (GLOVE) ×2
GLOVE INDICATOR 8.0 STRL GRN (GLOVE) ×3 IMPLANT
GOWN STRL REUS W/ TWL LRG LVL3 (GOWN DISPOSABLE) ×1 IMPLANT
GOWN STRL REUS W/ TWL XL LVL3 (GOWN DISPOSABLE) ×1 IMPLANT
GOWN STRL REUS W/TWL LRG LVL3 (GOWN DISPOSABLE) ×2
GOWN STRL REUS W/TWL XL LVL3 (GOWN DISPOSABLE) ×2
HOLDER FOLEY CATH W/STRAP (MISCELLANEOUS) ×3 IMPLANT
HOOD PEEL AWAY FLYTE STAYCOOL (MISCELLANEOUS) ×9 IMPLANT
IMMBOLIZER KNEE 19 BLUE UNIV (SOFTGOODS) ×3 IMPLANT
KIT TURNOVER KIT A (KITS) ×3 IMPLANT
NDL SAFETY ECLIPSE 18X1.5 (NEEDLE) ×2 IMPLANT
NEEDLE HYPO 18GX1.5 SHARP (NEEDLE) ×4
NEEDLE SPNL 20GX3.5 QUINCKE YW (NEEDLE) ×3 IMPLANT
NS IRRIG 1000ML POUR BTL (IV SOLUTION) ×3 IMPLANT
PACK TOTAL KNEE (MISCELLANEOUS) ×3 IMPLANT
PAD WRAPON POLAR KNEE (MISCELLANEOUS) ×1 IMPLANT
PLATE INTERLOK 6700 (Plate) ×3 IMPLANT
PULSAVAC PLUS IRRIG FAN TIP (DISPOSABLE) ×3
SOL .9 NS 3000ML IRR  AL (IV SOLUTION) ×2
SOL .9 NS 3000ML IRR UROMATIC (IV SOLUTION) ×1 IMPLANT
STAPLER SKIN PROX 35W (STAPLE) ×3 IMPLANT
SUCTION FRAZIER HANDLE 10FR (MISCELLANEOUS) ×2
SUCTION TUBE FRAZIER 10FR DISP (MISCELLANEOUS) ×1 IMPLANT
SUT VIC AB 0 CT1 36 (SUTURE) ×9 IMPLANT
SUT VIC AB 2-0 CT1 27 (SUTURE) ×6
SUT VIC AB 2-0 CT1 TAPERPNT 27 (SUTURE) ×3 IMPLANT
SYR 10ML LL (SYRINGE) ×3 IMPLANT
SYR 20CC LL (SYRINGE) ×3 IMPLANT
SYR 30ML LL (SYRINGE) ×9 IMPLANT
TIP FAN IRRIG PULSAVAC PLUS (DISPOSABLE) ×1 IMPLANT
TRAY FOLEY MTR SLVR 16FR STAT (SET/KITS/TRAYS/PACK) ×3 IMPLANT
WRAPON POLAR PAD KNEE (MISCELLANEOUS) ×3

## 2018-03-07 NOTE — Progress Notes (Signed)
Assessment done. Pt awake in bed without c/o . Pleasant and coopertive with care. Call bell in reach, instructed to call for needs.

## 2018-03-07 NOTE — Evaluation (Signed)
Physical Therapy Evaluation Patient Details Name: Isabella Luna MRN: [id number] DOB: 10-29-1960 Today's Date: 03/07/2018   History of Present Illness  Pt admitted for L TKR.  Clinical Impression  Pt is a pleasant 57 year old female who was admitted for L TKR. Pt performs bed mobility, transfers, and ambulation with cga and RW. Educated on [place] status prior to mobility efforts. Pt demonstrates deficits with strength/mobility/ROM/pain. Pt is motivated to perform therapy. Good progress with AROM. Would benefit from skilled PT to address above deficits and promote optimal return to PLOF. Recommend transition to [facility] upon discharge from acute hospitalization. Pt demonstrates ability to perform 10 SLRs with independence, therefore does not require KI for mobility. May possibly be able to progress to OP PT if she tolerates therapy well. Discussed with CM.        Follow Up Recommendations Home health PT    Equipment Recommendations  Rolling walker with 5" wheels;3in1 (PT)    Recommendations for Other Services       Precautions / Restrictions Precautions Precautions: Fall;Knee Precaution Booklet Issued: No Restrictions Weight Bearing Restrictions: Yes LLE Weight Bearing: Weight bearing as tolerated      Mobility  Bed Mobility Overal bed mobility: Needs Assistance Bed Mobility: Supine to Sit     Supine to sit: Min guard     General bed mobility comments: needs slight assist with cues for sequencing and guidance for sliding L LE to floor. Able to follow commands well  Transfers Overall transfer level: Needs assistance Equipment used: Rolling walker (2 wheeled) Transfers: Sit to/from Stand Sit to Stand: Min guard         General transfer comment: safe technique with cues to push from seated surface. Pt educated in proper use of AD. UPright posture noted  Ambulation/Gait Ambulation/Gait assistance: Min guard Ambulation Distance (Feet): 15 Feet Assistive device: Rolling  walker (2 wheeled) Gait Pattern/deviations: Step-to pattern     General Gait Details: ambulated in room with short step to gait pattern. Tends to pick RW up rather than roll on floor. No increased pain noted. No LOB during turns  Financial trader Rankin (Stroke Patients Only)       Balance Overall balance assessment: Needs assistance Sitting-balance support: Feet supported Sitting balance-Leahy Scale: Good     Standing balance support: Bilateral upper extremity supported Standing balance-Leahy Scale: Good                               Pertinent Vitals/Pain Pain Assessment: 0-10 Pain Score: 1  Pain Location: L knee Pain Descriptors / Indicators: Operative site guarding Pain Intervention(s): Limited activity within patient's tolerance;Repositioned;Premedicated before session;Ice applied    Home Living Family/patient expects to be discharged to:: Private residence Living Arrangements: Spouse/significant other;Children(son is able to provide 24/7 assist) Available Help at Discharge: Family Type of Home: House Home Access: Stairs to enter Entrance Stairs-Rails: Left Entrance Stairs-Number of Steps: 4 Home Layout: One level Home Equipment: None      Prior Function Level of Independence: Independent         Comments: working at a bank with desk job. Indep prior to admission, no falls     Hand Dominance        Extremity/Trunk Assessment   Upper Extremity Assessment Upper Extremity Assessment: Overall WFL for tasks assessed    Lower Extremity Assessment Lower Extremity  Assessment: Generalized weakness(L LE grossly 3/5; R LE grossly 4+/5)       Communication   Communication: No difficulties  Cognition Arousal/Alertness: Awake/alert Behavior During Therapy: WFL for tasks assessed/performed Overall Cognitive Status: Within Functional Limits for tasks assessed                                         General Comments      Exercises Total Joint Exercises Goniometric ROM: L knee AROM: 8-68 degrees Other Exercises Other Exercises: supine ther-ex performed on L LE including 10-12 reps of ankle pumps, quad sets, SLRs, and SAQ. All ther-ex performed with cga and cues for sequencing   Assessment/Plan    PT Assessment Patient needs continued PT services  PT Problem List Decreased strength;Decreased range of motion;Decreased balance;Decreased knowledge of use of DME;Pain       PT Treatment Interventions DME instruction;Gait training;Stair training;Therapeutic exercise;Balance training    PT Goals (Current goals can be found in the Care Plan section)  Acute Rehab PT Goals Patient Stated Goal: to do things she wasn't previously able to accomplish PT Goal Formulation: With patient Time For Goal Achievement: 03/21/18 Potential to Achieve Goals: Good    Frequency BID   Barriers to discharge        Co-evaluation               AM-PAC PT "6 Clicks" Daily Activity  Outcome Measure Difficulty turning over in bed (including adjusting bedclothes, sheets and blankets)?: A Little Difficulty moving from lying on back to sitting on the side of the bed? : Unable Difficulty sitting down on and standing up from a chair with arms (e.g., wheelchair, bedside commode, etc,.)?: Unable Help needed moving to and from a bed to chair (including a wheelchair)?: A Little Help needed walking in hospital room?: A Little Help needed climbing 3-5 steps with a railing? : A Lot 6 Click Score: 13    End of Session Equipment Utilized During Treatment: Gait belt Activity Tolerance: Patient tolerated treatment well Patient left: in chair;with chair alarm set;with SCD's reapplied Nurse Communication: Mobility status PT Visit Diagnosis: Muscle weakness (generalized) (M62.81);Pain;Difficulty in walking, not elsewhere classified (R26.2) Pain - Right/Left: Left Pain - part of body: Knee    Time:  9373-4287 PT Time Calculation (min) (ACUTE ONLY): 44 min   Charges:   PT Evaluation $PT Eval Low Complexity: 1 Low PT Treatments $Therapeutic Exercise: 8-22 mins   PT G Codes:        Isabella Luna, PT, DPT [phone]   Isabella Luna 03/07/2018, 4:49 PM

## 2018-03-07 NOTE — Anesthesia Preprocedure Evaluation (Signed)
Anesthesia Evaluation  Patient identified by MRN, date of birth, ID band Patient awake    Reviewed: Allergy & Precautions, H&P , NPO status , Patient's Chart, lab work & pertinent test results  History of Anesthesia Complications (+) PONV and history of anesthetic complications  Airway Mallampati: III  TM Distance: <3 FB Neck ROM: full    Dental  (+) Chipped, Poor Dentition   Pulmonary former smoker,  Signs and symptoms suggestive of sleep apnea            Cardiovascular Exercise Tolerance: Good (-) angina(-) Past MI and (-) DOE negative cardio ROS       Neuro/Psych negative neurological ROS  negative psych ROS   GI/Hepatic Neg liver ROS, GERD  Medicated and Controlled,  Endo/Other  negative endocrine ROS  Renal/GU      Musculoskeletal  (+) Arthritis ,   Abdominal   Peds  Hematology negative hematology ROS (+)   Anesthesia Other Findings Past Medical History: No date: Arthritis No date: Cancer (HCC)     Comment:  skin No date: GERD (gastroesophageal reflux disease) No date: Mood changes No date: PONV (postoperative nausea and vomiting)     Comment:  after Hysterectomy  No date: Wears hearing aid in both ears  Past Surgical History: No date: ABDOMINAL HYSTERECTOMY No date: BLADDER SUSPENSION No date: BUNIONECTOMY; Bilateral No date: COLONOSCOPY WITH ESOPHAGOGASTRODUODENOSCOPY (EGD) 05/04/2017: KNEE ARTHROSCOPY WITH MEDIAL MENISECTOMY; Left     Comment:  Procedure: Arthroscopic medial meniscus repair and               arthroscopic partial lateral meniscectomy, right knee. ;               Surgeon: Corky Mull, MD;  Location: [place];  Service: Orthopedics;  Laterality: Left;  BMI    Body Mass Index:  37.76 kg/m      Reproductive/Obstetrics negative OB ROS                             Anesthesia Physical Anesthesia Plan  ASA: III  Anesthesia  Plan: Spinal   Post-op Pain Management:    Induction:   PONV Risk Score and Plan:   Airway Management Planned: Natural Airway and Nasal Cannula  Additional Equipment:   Intra-op Plan:   Post-operative Plan:   Informed Consent: I have reviewed the patients History and Physical, chart, labs and discussed the procedure including the risks, benefits and alternatives for the proposed anesthesia with the patient or authorized representative who has indicated his/her understanding and acceptance.   Dental Advisory Given  Plan Discussed with: Anesthesiologist, CRNA and Surgeon  Anesthesia Plan Comments: (Patient reports no bleeding problems and no anticoagulant use.  Plan for spinal with backup GA  Patient consented for risks of anesthesia including but not limited to:  - adverse reactions to medications - risk of bleeding, infection, nerve damage and headache - risk of failed spinal - damage to teeth, lips or other oral mucosa - sore throat or hoarseness - Damage to heart, brain, lungs or loss of life  Patient voiced understanding.)        Anesthesia Quick Evaluation

## 2018-03-07 NOTE — NC FL2 (Signed)
[place] LEVEL OF CARE SCREENING TOOL     IDENTIFICATION  Patient Name: FRANCELY CRAW Birthdate: 1961-06-13 Sex: female Admission Date (Current Location): 03/07/2018  [place] and [place] Number:  Engineering geologist and Address:  [facility], [address] [place], [place] [address]      Provider Number: [id number]  Attending Physician Name and Address:  Corky Mull, MD  Relative Name and Phone Number:       Current Level of Care: Hospital Recommended Level of Care: [place] Prior Approval Number:    Date Approved/Denied:   PASRR Number: [id number])  Discharge Plan: SNF    Current Diagnoses: Patient Active Problem List   Diagnosis Date Noted  . Status post total knee replacement using cement, left 03/07/2018    Orientation RESPIRATION BLADDER Height & Weight     Self, Time, Situation, Place  Normal Continent Weight: 220 lb (99.8 kg) Height:  5\' 4"  (162.6 cm)  BEHAVIORAL SYMPTOMS/MOOD NEUROLOGICAL BOWEL NUTRITION STATUS      Continent Diet(Diet: Clear Liquid to be Advanced. )  AMBULATORY STATUS COMMUNICATION OF NEEDS Skin   Extensive Assist Verbally Surgical wounds(Incision: Left Knee. )                       Personal Care Assistance Level of Assistance  Bathing, Feeding, Dressing Bathing Assistance: Limited assistance Feeding assistance: Independent Dressing Assistance: Limited assistance     Functional Limitations Info  Sight, Hearing, Speech Sight Info: Adequate Hearing Info: Adequate Speech Info: Adequate    SPECIAL CARE FACTORS FREQUENCY  PT (By licensed PT), OT (By licensed OT)     PT Frequency: (5) OT Frequency: (5)            Contractures      Additional Factors Info  Code Status, Allergies Code Status Info: (Full Code. ) Allergies Info: (Cortisone)           Current Medications (03/07/2018):  This is the current hospital active medication list Current  Facility-Administered Medications  Medication Dose Route Frequency Provider Last Rate Last Dose  . 0.9 %  sodium chloride infusion   Intravenous Continuous Poggi, Marshall Cork, MD 100 mL/hr at 03/07/18 1105    . acetaminophen (TYLENOL) tablet 1,000 mg  1,000 mg Oral Q6H Poggi, Marshall Cork, MD   1,000 mg at 03/07/18 1225  . [START ON 03/08/2018] acetaminophen (TYLENOL) tablet 325-650 mg  325-650 mg Oral Q6H PRN Poggi, Marshall Cork, MD      . bisacodyl (DULCOLAX) suppository 10 mg  10 mg Rectal Daily PRN Poggi, Marshall Cork, MD      . ceFAZolin (ANCEF) IVPB 2g/100 mL premix  2 g Intravenous Q6H Poggi, Marshall Cork, MD 200 mL/hr at 03/07/18 1441 2 g at 03/07/18 1441  . [START ON 03/08/2018] citalopram (CELEXA) tablet 30 mg  30 mg Oral Daily Poggi, Marshall Cork, MD      . diphenhydrAMINE (BENADRYL) 12.5 MG/5ML elixir 12.5-25 mg  12.5-25 mg Oral Q4H PRN Poggi, Marshall Cork, MD      . docusate sodium (COLACE) capsule 100 mg  100 mg Oral BID Poggi, Marshall Cork, MD      . Derrill Memo ON 03/08/2018] enoxaparin (LOVENOX) injection 40 mg  40 mg Subcutaneous Q24H Poggi, Marshall Cork, MD      . famotidine (PEPCID) 20 MG tablet           . HYDROmorphone (DILAUDID) injection 0.5-1 mg  0.5-1 mg Intravenous Q4H  PRN Poggi, Marshall Cork, MD      . ketorolac (TORADOL) 15 MG/ML injection 15 mg  15 mg Intravenous Q6H Poggi, Marshall Cork, MD   15 mg at 03/07/18 1224  . magnesium hydroxide (MILK OF MAGNESIA) suspension 30 mL  30 mL Oral Daily PRN Poggi, Marshall Cork, MD      . metoCLOPramide (REGLAN) tablet 5-10 mg  5-10 mg Oral Q8H PRN Poggi, Marshall Cork, MD       Or  . metoCLOPramide (REGLAN) injection 5-10 mg  5-10 mg Intravenous Q8H PRN Poggi, Marshall Cork, MD      . ondansetron (ZOFRAN) tablet 4 mg  4 mg Oral Q6H PRN Poggi, Marshall Cork, MD       Or  . ondansetron (ZOFRAN) injection 4 mg  4 mg Intravenous Q6H PRN Poggi, Marshall Cork, MD      . oxyCODONE (Oxy IR/ROXICODONE) immediate release tablet 5-10 mg  5-10 mg Oral Q4H PRN Poggi, Marshall Cork, MD      . pantoprazole (PROTONIX) EC tablet 40 mg  40 mg Oral Daily Poggi,  Marshall Cork, MD      . pseudoephedrine (SUDAFED) tablet 30 mg  30 mg Oral Q4H PRN Poggi, Marshall Cork, MD      . scopolamine (TRANSDERM-SCOP) 1 MG/3DAYS           . simvastatin (ZOCOR) tablet 20 mg  20 mg Oral q1800 Poggi, Marshall Cork, MD      . sodium phosphate (FLEET) 7-19 GM/118ML enema 1 enema  1 enema Rectal Once PRN Poggi, Marshall Cork, MD      . traMADol Veatrice Bourbon) tablet 50 mg  50 mg Oral Q6H PRN Poggi, Marshall Cork, MD      . valACYclovir (VALTREX) tablet 2,000 mg  2,000 mg Oral BID PRN Poggi, Marshall Cork, MD         Discharge Medications: Please see discharge summary for a list of discharge medications.  Relevant Imaging Results:  Relevant Lab Results:   Additional Information (SSN: [id number])  Adabella Stanis, Veronia Beets, LCSW

## 2018-03-07 NOTE — Progress Notes (Signed)
Awakened easily on rounds. cpm machine removed from pt. Call bell in reach. Both heels off bed.

## 2018-03-07 NOTE — Op Note (Signed)
03/07/2018  9:45 AM  Patient:   Isabella Luna  Pre-Op Diagnosis:   Degenerative joint disease, left knee.  Post-Op Diagnosis:   Same  Procedure:   Left TKA using all-cemented [employer] Vanguard system with a 65 mm PCR femur and a 67 mm tibial tray with a 10 mm AS E-poly insert.  Surgeon:   Pascal Lux, MD  Assistant:   Cameron Proud, PA-C   Anesthesia:   Spinal  Findings:   As above  Complications:   None  EBL:   10 cc  Fluids:   1000 cc crystalloid  UOP:   550 cc  TT:   75 minutes at 300 mmHg  Drains:   None  Closure:   Staples  Implants:   As above  Brief Clinical Note:   The patient is a 57 year old female with a long history of progressively worsening left knee pain. The patient's symptoms have progressed despite medications, activity modification, injections, etc. The patient's history and examination were consistent with advanced degenerative joint disease of the right knee confirmed by plain radiographs. The patient presents at this time for a left total knee arthroplasty.  Procedure:   The patient was brought into the operating room. After adequate spinal anesthesia was obtained, the patient was lain in the supine position. A Foley catheter was placed by the nurse before the right lower extremity was prepped with ChloraPrep solution and draped sterilely. Preoperative antibiotics were administered. After verifying the proper laterality with a surgical timeout, the limb was exsanguinated with an Esmarch and the tourniquet inflated to 300 mmHg. A standard anterior approach to the knee was made through an approximately 7 inch incision. The incision was carried down through the subcutaneous tissues to expose superficial retinaculum. This was split the length of the incision and the medial flap elevated sufficiently to expose the medial retinaculum. The medial retinaculum was incised, leaving a 3-4 mm cuff of tissue on the patella. This was extended distally along the medial  border of the patellar tendon and proximally through the medial third of the quadriceps tendon. A subtotal fat pad excision was performed before the soft tissues were elevated off the anteromedial and anterolateral aspects of the proximal tibia to the level of the collateral ligaments. The anterior portions of the medial and lateral menisci were removed, as was the anterior cruciate ligament. With the knee flexed to 90, the external tibial guide was positioned and the appropriate proximal tibial cut made. This piece was taken to the back table where it was measured and found to be optimally replicated by a 67 mm component.  Attention was directed to the distal femur. The intramedullary canal was accessed through a 3/8" drill hole. The intramedullary guide was inserted and position in order to obtain a neutral flexion gap. The intercondylar block was positioned with care taken to avoid notching the anterior cortex of the femur. The appropriate cut was made. Next, the distal cutting block was placed at 6 of valgus alignment. Using the 9 mm slot, the distal cut was made. The distal femur was measured and found to be optimally replicated by the 65 mm component. The 65 mm 4-in-1 cutting block was positioned and first the posterior, then the posterior chamfer, the anterior chamfer, femoral and intercondylar cuts were made. At this point, the posterior portions medial and lateral menisci were removed. A trial reduction was performed using the appropriate femoral and tibial components with the 10 mm insert. This demonstrated excellent stability to varus and  valgus stressing both in flexion and extension while permitting full extension. Patella tracking was assessed and found to be excellent. Therefore, the tibial guide position was marked on the proximal tibia. The patella thickness was measured and found to be 17 mm. Therefore, given that the articular cartilage was in satisfactory condition, it was elected to not  resurface the patella. The lug holes were drilled into the distal femur before the trial component was removed, leaving only the tibial tray. The keel was then created using the appropriate tower, reamer, and punch.  The bony surfaces were prepared for cementing by irrigating thoroughly with bacitracin saline solution. A bone plug was fashioned from some of the bone that had been removed previously and used to plug the distal femoral canal. In addition, 20 cc of Exparel diluted out to 60 cc with normal saline and 30 cc of 0.5% Sensorcaine were injected into the postero-medial and postero-lateral aspects of the knee, the medial and lateral gutter regions, and the peri-incisional tissues to help with postoperative analgesia. Meanwhile, the cement was being mixed on the back table. When it was ready, the tibial tray was cemented in first. The excess cement was removed using [employer] fast streamer. Next, the femoral component was impacted into place. Again, the excess cement was removed using [employer] fast streamer. The 10 mm trial insert was positioned and the knee brought into extension while the cement hardened. Once the cement had hardened, the knee was placed through a range of motion with the findings as described above. Therefore, the trial insert was removed and, after verifying that no cement had been retained posteriorly, the permanent AS 10 mm E-polyethylene insert was positioned and secured using the appropriate key locking mechanism. Again the knee was placed through a range of motion with the findings as described above.  The wound was copiously irrigated with bacitracin saline solution using the jet lavage system before the quadriceps tendon and retinacular layer were reapproximated using #0 Vicryl interrupted sutures. The superficial retinacular layer also was closed using a running #0 Vicryl suture. A total of 10 cc of transexemic acid (TXA) was injected intra-articularly before the subcutaneous tissues were  closed in several layers using 2-0 Vicryl interrupted sutures. The skin was closed using staples. A sterile honeycomb dressing was applied to the skin before the leg was wrapped with an Ace wrap to accommodate the polar pack. The patient was then awakened and returned to the recovery room in satisfactory condition after tolerating the procedure well.

## 2018-03-07 NOTE — Anesthesia Procedure Notes (Signed)
Spinal  Patient location during procedure: OR End time: 03/07/2018 7:35 AM Preanesthetic Checklist Completed: patient identified, site marked, surgical consent, pre-op evaluation, timeout performed, IV checked, risks and benefits discussed and monitors and equipment checked Spinal Block Patient position: sitting Prep: Betadine and ChloraPrep Patient monitoring: heart rate, continuous pulse ox, blood pressure and cardiac monitor Approach: midline Location: L4-5 Injection technique: single-shot Needle Needle type: Introducer and Pencan  Needle gauge: 24 G Needle length: 9 cm Additional Notes Negative paresthesia. Negative blood return. Positive free-flowing CSF. Expiration date of kit checked and confirmed. Patient tolerated procedure well, without complications.

## 2018-03-07 NOTE — Transfer of Care (Signed)
Immediate Anesthesia Transfer of Care Note  Patient: Isabella Luna  Procedure(s) Performed: TOTAL KNEE ARTHROPLASTY (Left Knee)  Patient Location: PACU  Anesthesia Type:Spinal  Level of Consciousness: awake, alert , oriented and patient cooperative  Airway & Oxygen Therapy: Patient Spontanous Breathing and Patient connected to nasal cannula oxygen  Post-op Assessment: Report given to RN and Post -op Vital signs reviewed and stable  Post vital signs: Reviewed and stable  Last Vitals:  Vitals Value Taken Time  BP    Temp    Pulse 96 03/07/2018  9:48 AM  Resp    SpO2 100 % 03/07/2018  9:48 AM  Vitals shown include unvalidated device data.  Last Pain:  Vitals:   03/07/18 0610  TempSrc: Temporal  PainSc: 2          Complications: No apparent anesthesia complications

## 2018-03-07 NOTE — H&P (Signed)
Paper H&P to be scanned into permanent record. H&P reviewed and patient re-examined. No changes. 

## 2018-03-07 NOTE — Anesthesia Post-op Follow-up Note (Signed)
Anesthesia QCDR form completed.        

## 2018-03-08 LAB — BASIC METABOLIC PANEL
ANION GAP: 6 (ref 5–15)
BUN: 7 mg/dL (ref 6–20)
CHLORIDE: 105 mmol/L (ref 101–111)
CO2: 27 mmol/L (ref 22–32)
CREATININE: 0.68 mg/dL (ref 0.44–1.00)
Calcium: 8.5 mg/dL — ABNORMAL LOW (ref 8.9–10.3)
GFR calc non Af Amer: >60 mL/min (ref >60)
Glucose, Bld: 126 mg/dL — ABNORMAL HIGH (ref 65–99)
POTASSIUM: 3.8 mmol/L (ref 3.5–5.1)
Sodium: 138 mmol/L (ref 135–145)

## 2018-03-08 LAB — CBC WITH DIFFERENTIAL/PLATELET
Basophils Absolute: 0 10*3/uL (ref 0–0.1)
Basophils Relative: 1 %
EOS ABS: 0.1 10*3/uL (ref 0–0.7)
EOS PCT: 1 %
HCT: 40.6 % (ref 35.0–47.0)
Hemoglobin: 13.8 g/dL (ref 12.0–16.0)
Lymphocytes Relative: 17 %
Lymphs Abs: 1.1 10*3/uL (ref 1.0–3.6)
MCH: 29.2 pg (ref 26.0–34.0)
MCHC: 33.9 g/dL (ref 32.0–36.0)
MCV: 86.2 fL (ref 80.0–100.0)
MONO ABS: 0.6 10*3/uL (ref 0.2–0.9)
MONOS PCT: 10 %
Neutro Abs: 4.4 10*3/uL (ref 1.4–6.5)
Neutrophils Relative %: 71 %
PLATELETS: 174 10*3/uL (ref 150–440)
RBC: 4.71 MIL/uL (ref 3.80–5.20)
RDW: 12.5 % (ref 11.5–14.5)
WBC: 6.2 10*3/uL (ref 3.6–11.0)

## 2018-03-08 NOTE — Care Management Note (Signed)
Case Management Note  Patient Details  Name: KELCIE CURRIE MRN: [id number] Date of Birth: Feb 03, 1961  Subjective/Objective: Met with patient at bedside to discuss discharge planning. PT recommending outpatient PT. Spoke with Mia [place], [place] who will set up outpatient PT at [facility] clinic and inform patient of date and time.  Pharmacy-[facility] [phone]. Called Lovenox 40 mg # 14 no refills. She will need a walker, Ordered from [place] with [facility].                      Action/Plan:   Expected Discharge Date:  03/09/18               Expected Discharge Plan:  OP Rehab  In-House Referral:     Discharge planning Services  CM Consult  Post Acute Care Choice:  Durable Medical Equipment Choice offered to:  Patient  DME Arranged:  [facility] Rile rolling DME Agency:  [place]:    [facility] Agency:     Status of Service:  In process, will continue to follow  If discussed at Long Length of Stay Meetings, dates discussed:    Additional Comments:  Jolly Mango, RN 03/08/2018, 10:01 AM

## 2018-03-08 NOTE — Progress Notes (Signed)
Sleeping on rounds, resp easy

## 2018-03-08 NOTE — Progress Notes (Signed)
  Subjective: 1 Day Post-Op Procedure(s) (LRB): TOTAL KNEE ARTHROPLASTY (Left) Patient reports pain as mild.   Patient is well, and has had no acute complaints or problems PT and Care management to assist with discharge planning. Negative for chest pain and shortness of breath Fever: no Gastrointestinal:Negative for nausea and vomiting  Objective: Vital signs in last 24 hours: Temp:  [97 F (36.1 C)-98.6 F (37 C)] 98.6 F (37 C) (06/05 0354) Pulse Rate:  [57-84] 58 (06/05 0354) Resp:  [16-19] 16 (06/05 0354) BP: (112-148)/(47-70) 121/49 (06/05 0354) SpO2:  [98 %-100 %] 98 % (06/05 0354)  Intake/Output from previous day:  Intake/Output Summary (Last 24 hours) at 03/08/2018 0750 Last data filed at 03/08/2018 0600 Gross per 24 hour  Intake 4081.67 ml  Output 3960 ml  Net 121.67 ml    Intake/Output this shift: No intake/output data recorded.  Labs: Recent Labs    03/08/18 0442  HGB 13.8   Recent Labs    03/08/18 0442  WBC 6.2  RBC 4.71  HCT 40.6  PLT 174   Recent Labs    03/08/18 0442  NA 138  K 3.8  CL 105  CO2 27  BUN 7  CREATININE 0.68  GLUCOSE 126*  CALCIUM 8.5*   No results for input(s): LABPT, INR in the last 72 hours.   EXAM General - Patient is Alert, Appropriate and Oriented Extremity - ABD soft Sensation intact distally Intact pulses distally Dorsiflexion/Plantar flexion intact Incision: dressing C/D/I No cellulitis present Dressing/Incision - clean, dry, no drainage Motor Function - intact, moving foot and toes well on exam.  Abdomen soft with normal BS this AM.  Past Medical History:  Diagnosis Date  . Arthritis   . Cancer ([place])    skin  . GERD (gastroesophageal reflux disease)   . Mood changes   . PONV (postoperative nausea and vomiting)    after Hysterectomy   . Wears hearing aid in both ears     Assessment/Plan: 1 Day Post-Op Procedure(s) (LRB): TOTAL KNEE ARTHROPLASTY (Left) Active Problems:   Status post total knee  replacement using cement, left  Estimated body mass index is 37.76 kg/m as calculated from the following:   Height as of this encounter: 5\' 4"  (1.626 m).   Weight as of this encounter: 99.8 kg (220 lb). Advance diet Up with therapy D/C IV fluids when tolerating po intake.  Labs reviewed this AM. Minimal pain this AM. Passing gas, will work on BM. Plan will be for discharge home with HHPT tomorrow.  DVT Prophylaxis - Lovenox, Foot Pumps and TED hose Weight-Bearing as tolerated to right leg  J. Cameron Proud, PA-C [facility] Orthopaedic Surgery 03/08/2018, 7:50 AM

## 2018-03-08 NOTE — Progress Notes (Signed)
Assessment doen. Pt awake in bed without c/o. cpm machine on lt leg at 80 degree flexion, pt tolerating well. Rates pain 2/10 and pt states this is a tolerable level for her. Call bell in reach and instructed to call for needs.

## 2018-03-08 NOTE — Progress Notes (Signed)
Foley removed without event.

## 2018-03-08 NOTE — Progress Notes (Signed)
Physical Therapy Treatment Patient Details Name: Isabella Luna MRN: [id number] DOB: [birth year] Today's Date: 03/08/2018    History of Present Illness Pt admitted for L TKR.    PT Comments    Patient continues to progress towards her goals. She demonstrates the ability to ambulate increasing distances with RW without any near falls or LOB. Patient requires only min verbal cuing for HEP. Patients pain remains to be well controlled with pain medication upon request. Patient states that she has had a bowel movement and is progressing toward discharge with a PT recommendation of home with outpatient physical therapy. PT will continue to see pt BID while she is admitted. PT plans to attempt stair training at next visit. Written HEP given and reviewed.    Follow Up Recommendations  Outpatient PT     Equipment Recommendations  Rolling walker with 5" wheels;3in1 (PT)    Recommendations for Other Services       Precautions / Restrictions Precautions Precautions: Fall;Knee Precaution Booklet Issued: Yes (comment) Restrictions Weight Bearing Restrictions: Yes LLE Weight Bearing: Weight bearing as tolerated    Mobility  Bed Mobility Overal bed mobility: Needs Assistance Bed Mobility: Supine to Sit;Sit to Supine     Supine to sit: Supervision Sit to supine: Supervision   General bed mobility comments: pt performs bed mobility safely with minimal cues and guarding  Transfers Overall transfer level: Needs assistance Equipment used: Rolling walker (2 wheeled) Transfers: Sit to/from Stand Sit to Stand: Min guard         General transfer comment: safe technique with RW, upright posture  Ambulation/Gait Ambulation/Gait assistance: Min guard Ambulation Distance (Feet): 240 Feet Assistive device: Rolling walker (2 wheeled) Gait Pattern/deviations: Step-through pattern Gait velocity: 10' in 15 seconds   General Gait Details: pt ambulated 240' with contact guard assist and minimal  cuing for use of RW. pt demonstrates the ability to navigate enviornment safely with no LOB. pt complains of more than normal fatigue after ambulation   Stairs             Wheelchair Mobility    Modified Rankin (Stroke Patients Only)       Balance Overall balance assessment: Needs assistance Sitting-balance support: Feet supported Sitting balance-Leahy Scale: Good     Standing balance support: Bilateral upper extremity supported Standing balance-Leahy Scale: Good                              Cognition Arousal/Alertness: Awake/alert Behavior During Therapy: WFL for tasks assessed/performed Overall Cognitive Status: Within Functional Limits for tasks assessed                                        Exercises Total Joint Exercises Ankle Circles/Pumps: Left;15 reps Quad Sets: Left;15 reps Gluteal Sets: Left;15 reps Short Arc Quad: Left;Other reps (comment)(12 reps) Hip ABduction/ADduction: Left;Other reps (comment)(12 reps) Straight Leg Raises: Left;Other reps (comment)(12 reps) Long Arc Quad: Left;10 reps    General Comments        Pertinent Vitals/Pain Pain Score: 2  Pain Location: L knee Pain Descriptors / Indicators: Operative site guarding;Aching Pain Intervention(s): Limited activity within patient's tolerance;Monitored during session;Patient requesting pain meds-RN notified;Ice applied    Home Living                      Prior Function  PT Goals (current goals can now be found in the care plan section) Acute Rehab PT Goals Patient Stated Goal: to do things she wasn't previously able to accomplish PT Goal Formulation: With patient Time For Goal Achievement: 03/21/18 Potential to Achieve Goals: Good Progress towards PT goals: Progressing toward goals    Frequency    BID      PT Plan Current plan remains appropriate    Co-evaluation              AM-PAC PT "6 Clicks" Daily Activity   Outcome Measure  Difficulty turning over in bed (including adjusting bedclothes, sheets and blankets)?: None Difficulty moving from lying on back to sitting on the side of the bed? : None Difficulty sitting down on and standing up from a chair with arms (e.g., wheelchair, bedside commode, etc,.)?: Unable Help needed moving to and from a bed to chair (including a wheelchair)?: A Little Help needed walking in hospital room?: None Help needed climbing 3-5 steps with a railing? : A Little 6 Click Score: 19    End of Session Equipment Utilized During Treatment: Gait belt Activity Tolerance: Patient tolerated treatment well Patient left: in bed;with bed alarm set Nurse Communication: Patient requests pain meds PT Visit Diagnosis: Muscle weakness (generalized) (M62.81);Pain;Difficulty in walking, not elsewhere classified (R26.2) Pain - Right/Left: Left Pain - part of body: Knee     Time: 5643-3295 PT Time Calculation (min) (ACUTE ONLY): 30 min  Charges:                       G Codes:       Rozell Theiler Marylu Lund, SPT   Lassie Demorest 03/08/2018, 3:33 PM

## 2018-03-08 NOTE — Progress Notes (Signed)
No acute events overnight. Slept in long intervals. Vss. Pain control has been good with scheduled pain meds. polor care on. Call bell in reach.

## 2018-03-08 NOTE — Progress Notes (Signed)
Physical Therapy Treatment Patient Details Name: Isabella Luna MRN: [id number] DOB: 01-03-1961 Today's Date: 03/08/2018    History of Present Illness Pt admitted for L TKR.    PT Comments    Pt is making good progress towards goals with improvement in ambulation distance and fluid gait sequencing. Good endurance noted with there-ex and AAROM. Pt able to ambulate 10' in 23 seconds demonstrating slow speed, however no pain noted. Will continue to progress. Pt very eager to participate in therapy. Is wanting to progress straight to OP PT, has transportation. Discussed with CM.    Follow Up Recommendations  Outpatient PT     Equipment Recommendations  Rolling walker with 5" wheels;3in1 (PT)    Recommendations for Other Services       Precautions / Restrictions Precautions Precautions: Fall;Knee Precaution Booklet Issued: No Restrictions Weight Bearing Restrictions: Yes LLE Weight Bearing: Weight bearing as tolerated    Mobility  Bed Mobility Overal bed mobility: Needs Assistance Bed Mobility: Sit to Supine       Sit to supine: Supervision   General bed mobility comments: safe technique with only slight cues needed for sequencing  Transfers Overall transfer level: Needs assistance Equipment used: Rolling walker (2 wheeled) Transfers: Sit to/from Stand Sit to Stand: Min guard         General transfer comment: safe technique with RW, upright posture  Ambulation/Gait Ambulation/Gait assistance: Min guard Ambulation Distance (Feet): 120 Feet Assistive device: Rolling walker (2 wheeled) Gait Pattern/deviations: Step-through pattern     General Gait Details: ambulated in room with step to gait pattern, progressing to reciprocal gait pattern with improved fluid gait pattern. Slow gait speed noted. Needs cues to look forward and L heel strike   Stairs             Wheelchair Mobility    Modified Rankin (Stroke Patients Only)       Balance                                             Cognition Arousal/Alertness: Awake/alert Behavior During Therapy: WFL for tasks assessed/performed Overall Cognitive Status: Within Functional Limits for tasks assessed                                        Exercises Total Joint Exercises Goniometric ROM: L knee AAROM: 4-87 degrees Other Exercises Other Exercises: supine ther-ex performed on L LE including 12 reps of ankle pumps, quad sets, SLRs, hip abd/add and SAQ. All ther-ex performed with cga and cues for sequencing    General Comments        Pertinent Vitals/Pain Pain Assessment: No/denies pain    Home Living                      Prior Function            PT Goals (current goals can now be found in the care plan section) Acute Rehab PT Goals Patient Stated Goal: to do things she wasn't previously able to accomplish PT Goal Formulation: With patient Time For Goal Achievement: 03/21/18 Potential to Achieve Goals: Good Progress towards PT goals: Progressing toward goals    Frequency    BID      PT Plan Discharge plan needs to be  updated    Co-evaluation              AM-PAC PT "6 Clicks" Daily Activity  Outcome Measure  Difficulty turning over in bed (including adjusting bedclothes, sheets and blankets)?: None Difficulty moving from lying on back to sitting on the side of the bed? : None Difficulty sitting down on and standing up from a chair with arms (e.g., wheelchair, bedside commode, etc,.)?: Unable Help needed moving to and from a bed to chair (including a wheelchair)?: A Little Help needed walking in hospital room?: A Little Help needed climbing 3-5 steps with a railing? : A Little 6 Click Score: 18    End of Session Equipment Utilized During Treatment: Gait belt Activity Tolerance: Patient tolerated treatment well Patient left: in bed;with bed alarm set;with SCD's reapplied Nurse Communication: Mobility status PT  Visit Diagnosis: Muscle weakness (generalized) (M62.81);Pain;Difficulty in walking, not elsewhere classified (R26.2) Pain - Right/Left: Left Pain - part of body: Knee     Time: 1610-9604 PT Time Calculation (min) (ACUTE ONLY): 25 min  Charges:  $Gait Training: 8-22 mins $Therapeutic Exercise: 8-22 mins                    G Codes:       Greggory Stallion, PT, DPT [phone]    Isabella Luna 03/08/2018, 11:15 AM

## 2018-03-08 NOTE — Progress Notes (Signed)
Clinical Social Worker (CSW) received SNF consult. PT is recommending outpatient PT. RN case manager aware of above. Please reconsult if future social work needs arise. CSW signing off.   Emalina Dubreuil, LCSW [phone]  

## 2018-03-08 NOTE — Anesthesia Postprocedure Evaluation (Signed)
Anesthesia Post Note  Patient: Isabella Luna  Procedure(s) Performed: TOTAL KNEE ARTHROPLASTY (Left Knee)  Patient location during evaluation: Nursing Unit Anesthesia Type: Spinal Level of consciousness: awake and alert and oriented Pain management: satisfactory to patient Vital Signs Assessment: post-procedure vital signs reviewed and stable Respiratory status: respiratory function stable Cardiovascular status: stable Postop Assessment: no backache, no headache, spinal receding, no apparent nausea or vomiting, patient able to bend at knees, adequate PO intake and able to ambulate Anesthetic complications: no     Last Vitals:  Vitals:   03/07/18 2303 03/08/18 0354  BP: 121/60 (!) 121/49  Pulse: 62 (!) 58  Resp: 19 16  Temp: 37 C 37 C  SpO2: 100% 98%    Last Pain:  Vitals:   03/08/18 0634  TempSrc:   PainSc: 0-No pain                 Blima Singer

## 2018-03-09 LAB — BASIC METABOLIC PANEL
Anion gap: 9 (ref 5–15)
BUN: 9 mg/dL (ref 6–20)
CALCIUM: 9 mg/dL (ref 8.9–10.3)
CO2: 25 mmol/L (ref 22–32)
CREATININE: 0.68 mg/dL (ref 0.44–1.00)
Chloride: 102 mmol/L (ref 101–111)
GFR calc non Af Amer: >60 mL/min (ref >60)
Glucose, Bld: 139 mg/dL — ABNORMAL HIGH (ref 65–99)
Potassium: 3.6 mmol/L (ref 3.5–5.1)
SODIUM: 136 mmol/L (ref 135–145)

## 2018-03-09 LAB — CBC
HCT: 40.7 % (ref 35.0–47.0)
Hemoglobin: 13.9 g/dL (ref 12.0–16.0)
MCH: 29.2 pg (ref 26.0–34.0)
MCHC: 34.1 g/dL (ref 32.0–36.0)
MCV: 85.6 fL (ref 80.0–100.0)
PLATELETS: 191 10*3/uL (ref 150–440)
RBC: 4.76 MIL/uL (ref 3.80–5.20)
RDW: 12.4 % (ref 11.5–14.5)
WBC: 9.7 10*3/uL (ref 3.6–11.0)

## 2018-03-09 MED ORDER — TRAMADOL HCL 50 MG PO TABS: 50.0000 mg | ORAL_TABLET | Freq: Four times a day (QID) | ORAL | 0 refills | Status: DC | PRN

## 2018-03-09 MED ORDER — ENOXAPARIN SODIUM 40 MG/0.4ML ~~LOC~~ SOLN: 40.0000 mg | SUBCUTANEOUS | 0 refills | Status: DC

## 2018-03-09 MED ORDER — OXYCODONE HCL 5 MG PO TABS: 5.0000 mg | ORAL_TABLET | ORAL | 0 refills | Status: DC | PRN

## 2018-03-09 NOTE — Care Management Note (Signed)
Case Management Note  Patient Details  Name: GEARLDINE LOONEY MRN: [id number] Date of Birth: 1961/04/14  Subjective/Objective:  Discharging today with Outpatient PT                 Action/Plan: Cost of Lovenox is $ 94.07. Patient updated and denies issues paying for medication.   Expected Discharge Date:  03/09/18               Expected Discharge Plan:  OP Rehab  In-House Referral:     Discharge planning Services  CM Consult  Post Acute Care Choice:  Durable Medical Equipment Choice offered to:  Patient  DME Arranged:  Gilford Rile rolling DME Agency:  [place]:    [place]:     Status of Service:  Completed, signed off  If discussed at [place] of Stay Meetings, dates discussed:    Additional Comments:  Jolly Mango, RN 03/09/2018, 8:46 AM

## 2018-03-09 NOTE — Discharge Instructions (Signed)

## 2018-03-09 NOTE — Progress Notes (Signed)
Discharge instructions and medication detail reviewed with patient. All questions answered. Patient verbalizes understanding. IV removed. AVS and printed prescriptions given to patient. All patient belongings packed.  Patient was escorted out via wheelchair.

## 2018-03-09 NOTE — Progress Notes (Signed)
Physical Therapy Treatment Patient Details Name: Isabella Luna MRN: [id number] DOB: Jul 02, 1961 Today's Date: 03/09/2018    History of Present Illness Pt admitted for L TKR.    PT Comments    Patient continues to progress towards her goals. Her L knee ROM improved  To 2-90. Patient demonstrates independence with HEP. Patient is able to ambulate ~300 feet total with CGA safely managing RW and her enviornment. Pts gait speed continues to improve, able to amb 10' in 13 seconds. Pt able to ascend and descend 4 stairs using rail on the L safely with CGA after demonstration. Patient has met goals of inpatient rehab and is safe to d/c home with family support and scheduled out patient PT. Pt educated to perform HEP BID, continue use of cryotherapy and maintain extension with towel rolls until outpatient PT.   Follow Up Recommendations  Outpatient PT     Equipment Recommendations  Rolling walker with 5" wheels;3in1 (PT)    Recommendations for Other Services       Precautions / Restrictions Precautions Precautions: Fall;Knee Precaution Booklet Issued: Yes (comment) Restrictions Weight Bearing Restrictions: Yes LLE Weight Bearing: Weight bearing as tolerated    Mobility  Bed Mobility Overal bed mobility: Independent Bed Mobility: Supine to Sit     Supine to sit: Independent     General bed mobility comments: Pt able to independently perform bed mobility safely  Transfers Overall transfer level: Needs assistance Equipment used: Rolling walker (2 wheeled) Transfers: Sit to/from Stand Sit to Stand: Min guard         General transfer comment: safe technique with RW, upright posture  Ambulation/Gait Ambulation/Gait assistance: Min guard Ambulation Distance (Feet): 300 Feet Assistive device: Rolling walker (2 wheeled) Gait Pattern/deviations: Step-through pattern Gait velocity: 10' in 13 seconds   General Gait Details: pt ambulated 300' with contact guard assistance. pt  able to manage RW and enviornment safely   Stairs Stairs: Yes Stairs assistance: Min guard Stair Management: One rail Left Number of Stairs: 4 General stair comments: pt ascends and descends 4 stairs safely with CGA. Demonstration before and cuing for use of hand rail provided   Wheelchair Mobility    Modified Rankin (Stroke Patients Only)       Balance                                            Cognition Arousal/Alertness: Awake/alert Behavior During Therapy: WFL for tasks assessed/performed Overall Cognitive Status: Within Functional Limits for tasks assessed                                        Exercises Total Joint Exercises Goniometric ROM: L knee AAROM 2-90 Other Exercises Other Exercises: Supine ther-ex performed on L LE requiring supervision x 15 reps each: SLR, SAQ, ankle pumps, quad sets, glut sets, heel slides in sitting    General Comments        Pertinent Vitals/Pain Pain Assessment: 0-10 Pain Score: 1  Pain Location: L knee Pain Descriptors / Indicators: Operative site guarding;Aching Pain Intervention(s): Limited activity within patient's tolerance;Monitored during session;Premedicated before session;Ice applied    Home Living                      Prior Function  PT Goals (current goals can now be found in the care plan section) Acute Rehab PT Goals Patient Stated Goal: to do things she wasn't previously able to accomplish PT Goal Formulation: With patient Time For Goal Achievement: 03/21/18 Potential to Achieve Goals: Good Progress towards PT goals: Progressing toward goals    Frequency    BID      PT Plan Current plan remains appropriate    Co-evaluation              AM-PAC PT "6 Clicks" Daily Activity  Outcome Measure  Difficulty turning over in bed (including adjusting bedclothes, sheets and blankets)?: None Difficulty moving from lying on back to sitting on the  side of the bed? : None Difficulty sitting down on and standing up from a chair with arms (e.g., wheelchair, bedside commode, etc,.)?: A Little Help needed moving to and from a bed to chair (including a wheelchair)?: None Help needed walking in hospital room?: A Little Help needed climbing 3-5 steps with a railing? : A Little 6 Click Score: 21    End of Session Equipment Utilized During Treatment: Gait belt Activity Tolerance: Patient tolerated treatment well Patient left: in chair;with chair alarm set Nurse Communication: Other (comment)(Completion of inpatient PT) PT Visit Diagnosis: Muscle weakness (generalized) (M62.81);Pain;Difficulty in walking, not elsewhere classified (R26.2) Pain - Right/Left: Left Pain - part of body: Knee     Time: 4680-3212 PT Time Calculation (min) (ACUTE ONLY): 32 min  Charges:  $Gait Training: 8-22 mins $Therapeutic Exercise: 8-22 mins                    G Codes:       Neziah Braley, SPT   Meghin Thivierge 03/09/2018, 11:10 AM

## 2018-03-09 NOTE — Progress Notes (Signed)
Sleeping on rounds. resp easy. Call bell in reach.

## 2018-03-09 NOTE — Discharge Summary (Signed)
Physician Discharge Summary  Patient ID: Isabella Luna MRN: [id number] DOB/AGE: 56-Sep-1962 56 y.o.  Admit date: 03/07/2018 Discharge date: 03/09/2018  Admission Diagnoses:  primary osteoarthritis of left knee  Discharge Diagnoses: Patient Active Problem List   Diagnosis Date Noted  . Status post total knee replacement using cement, left 03/07/2018    Past Medical History:  Diagnosis Date  . Arthritis   . Cancer ([facility])    skin  . GERD (gastroesophageal reflux disease)   . Mood changes   . PONV (postoperative nausea and vomiting)    after Hysterectomy   . Wears hearing aid in both ears      Transfusion: None.   Consultants (if any):   Discharged Condition: Improved  Hospital Course: Isabella Luna is an 57 y.o. female who was admitted 03/07/2018 with a diagnosis of left knee osteoarthritis and went to the operating room on 03/07/2018 and underwent the above named procedures.    Surgeries: Procedure(s): TOTAL KNEE ARTHROPLASTY on 03/07/2018 Patient tolerated the surgery well. Taken to PACU where she was stabilized and then transferred to the orthopedic floor.  Started on Lovenox 40mg  q 24 hrs. Foot pumps applied bilaterally at 80 mm. Heels elevated on bed with rolled towels. No evidence of DVT. Negative [place]. Physical therapy started on day #1 for gait training and transfer. OT started day #1 for ADL and assisted devices.  Patient's IV was removed on POD1, foley removed shortly after surgery.  Implants: Left TKA using all-cemented [employer] Vanguard system with a 65 mm PCR femur and a 67 mm tibial tray with a 10 mm AS E-poly insert.  She was given perioperative antibiotics:  Anti-infectives (From admission, onward)   Start     Dose/Rate Route Frequency Ordered Stop   03/07/18 1400  ceFAZolin (ANCEF) IVPB 2g/100 mL premix     2 g 200 mL/hr over 30 Minutes Intravenous Every 6 hours 03/07/18 1051 03/08/18 0330   03/07/18 1051  valACYclovir (VALTREX) tablet 2,000 mg     2,000 mg  Oral 2 times daily PRN 03/07/18 1051     03/07/18 0607  ceFAZolin (ANCEF) 2-4 GM/100ML-% IVPB    Note to Pharmacy:  [name]   : cabinet override      03/07/18 0607 03/07/18 0753   03/06/18 2230  ceFAZolin (ANCEF) IVPB 2g/100 mL premix     2 g 200 mL/hr over 30 Minutes Intravenous  Once 03/06/18 2224 03/07/18 0805    .  She was given sequential compression devices, early ambulation, and Lovenox for DVT prophylaxis.  She benefited maximally from the hospital stay and there were no complications.    Recent vital signs:  Vitals:   03/08/18 0750 03/09/18 0028  BP: 126/61 (!) 144/62  Pulse: 66 83  Resp: 20 18  Temp: 98.7 F (37.1 C) 99.2 F (37.3 C)  SpO2: 98% 97%    Recent laboratory studies:  Lab Results  Component Value Date   HGB 13.9 03/09/2018   HGB 13.8 03/08/2018   HGB 15.2 02/22/2018   Lab Results  Component Value Date   WBC 9.7 03/09/2018   PLT 191 03/09/2018   Lab Results  Component Value Date   INR 0.93 02/22/2018   Lab Results  Component Value Date   NA 136 03/09/2018   K 3.6 03/09/2018   CL 102 03/09/2018   CO2 25 03/09/2018   BUN 9 03/09/2018   CREATININE 0.68 03/09/2018   GLUCOSE 139 (H) 03/09/2018    Discharge Medications:  Allergies as of 03/09/2018      Reactions   Cortisone Other (See Comments)   Facial redness, insomnia      Medication List    TAKE these medications   citalopram 20 MG tablet Commonly known as:  CELEXA Take 30 mg by mouth daily.   enoxaparin 40 MG/0.4ML injection Commonly known as:  LOVENOX Inject 0.4 mLs (40 mg total) into the skin daily.   naproxen sodium 220 MG tablet Commonly known as:  ALEVE Take 440 mg by mouth 2 (two) times daily as needed (for pain.).   oxyCODONE 5 MG immediate release tablet Commonly known as:  Oxy IR/ROXICODONE Take 1-2 tablets (5-10 mg total) by mouth every 4 (four) hours as needed for moderate pain.   pseudoephedrine 30 MG tablet Commonly known as:  SUDAFED Take 30 mg by  mouth every 4 (four) hours as needed for congestion.   simvastatin 20 MG tablet Commonly known as:  ZOCOR Take 20 mg by mouth daily.   traMADol 50 MG tablet Commonly known as:  ULTRAM Take 1-2 tablets (50-100 mg total) by mouth every 6 (six) hours as needed for moderate pain.   valACYclovir 1000 MG tablet Commonly known as:  VALTREX Take 2 g by mouth 2 (two) times daily as needed (for cold sores).            Durable Medical Equipment  (From admission, onward)        Start     Ordered   03/07/18 1052  DME Walker rolling  Once    Question:  Patient needs a walker to treat with the following condition  Answer:  Status post total knee replacement using cement, left   03/07/18 1051   03/07/18 1052  DME Bedside commode  Once    Question:  Patient needs a bedside commode to treat with the following condition  Answer:  Status post total knee replacement using cement, left   03/07/18 1051   03/07/18 1052  DME 3 n 1  Once     03/07/18 1051      Diagnostic Studies: Dg Chest 2 View  Result Date: 02/22/2018 CLINICAL DATA:  Preoperative evaluation for LEFT total knee replacement, former smoker EXAM: CHEST - 2 VIEW COMPARISON:  None. FINDINGS: Normal heart size, mediastinal contours, and pulmonary vascularity. Mild peribronchial thickening. Lungs clear. No pleural effusion or pneumothorax. Bones unremarkable. IMPRESSION: Mild bronchitic changes without infiltrate. Electronically Signed   By: Lavonia Dana M.D.   On: 02/22/2018 17:16   Dg Knee Left Port  Result Date: 03/07/2018 CLINICAL DATA:  Status post left total knee joint prosthesis placement. EXAM: PORTABLE LEFT KNEE - 1-2 VIEW COMPARISON:  Preoperative MRI of the knee of March 16, 2017 FINDINGS: The patient has undergone total knee joint prosthesis placement. Radiographic positioning of the prosthetic components is good. The interface with the native bone appears normal. There is a small amount of air in fluid in the anterior aspect of  the joint space. IMPRESSION: No immediate postprocedure complication following left total knee joint prosthesis placement. Electronically Signed   By: David  Martinique M.D.   On: 03/07/2018 10:00   Disposition: Plan is for discharge home this afternoon following PT.  Follow-up Information    Lattie Corns, PA-C Follow up in 14 day(s).   Specialty:  Physician Assistant Why:  Electa Sniff information: [place] [id number] [phone]          Signed: Judson Roch PA-C  03/09/2018, 7:29 AM

## 2018-03-09 NOTE — Progress Notes (Signed)
Assisted by 1 staff member up to br using walker. Pt tolerated activity without event and back to bed. Call bell in reach.

## 2018-03-09 NOTE — Progress Notes (Signed)
  Subjective: 2 Days Post-Op Procedure(s) (LRB): TOTAL KNEE ARTHROPLASTY (Left) Patient reports pain as mild.   Patient is well, and has had no acute complaints or problems Plan is for discharge home from hospital. Negative for chest pain and shortness of breath Fever: no Gastrointestinal:Negative for nausea and vomiting  Objective: Vital signs in last 24 hours: Temp:  [98.7 F (37.1 C)-99.2 F (37.3 C)] 99.2 F (37.3 C) (06/06 0028) Pulse Rate:  [66-83] 83 (06/06 0028) Resp:  [18-20] 18 (06/06 0028) BP: (126-144)/(61-62) 144/62 (06/06 0028) SpO2:  [97 %-98 %] 97 % (06/06 0028)  Intake/Output from previous day:  Intake/Output Summary (Last 24 hours) at 03/09/2018 0725 Last data filed at 03/08/2018 1840 Gross per 24 hour  Intake 1231.67 ml  Output 3 ml  Net 1228.67 ml    Intake/Output this shift: No intake/output data recorded.  Labs: Recent Labs    03/08/18 0442 03/09/18 0444  HGB 13.8 13.9   Recent Labs    03/08/18 0442 03/09/18 0444  WBC 6.2 9.7  RBC 4.71 4.76  HCT 40.6 40.7  PLT 174 191   Recent Labs    03/08/18 0442 03/09/18 0444  NA 138 136  K 3.8 3.6  CL 105 102  CO2 27 25  BUN 7 9  CREATININE 0.68 0.68  GLUCOSE 126* 139*  CALCIUM 8.5* 9.0   No results for input(s): LABPT, INR in the last 72 hours.   EXAM General - Patient is Alert, Appropriate and Oriented Extremity - ABD soft Sensation intact distally Intact pulses distally Dorsiflexion/Plantar flexion intact Incision: dressing C/D/I No cellulitis present Dressing/Incision - clean, dry, no drainage Motor Function - intact, moving foot and toes well on exam.  Abdomen soft with normal BS this AM.  Past Medical History:  Diagnosis Date  . Arthritis   . Cancer ([place])    skin  . GERD (gastroesophageal reflux disease)   . Mood changes   . PONV (postoperative nausea and vomiting)    after Hysterectomy   . Wears hearing aid in both ears     Assessment/Plan: 2 Days Post-Op  Procedure(s) (LRB): TOTAL KNEE ARTHROPLASTY (Left) Active Problems:   Status post total knee replacement using cement, left  Estimated body mass index is 37.76 kg/m as calculated from the following:   Height as of this encounter: 5\' 4"  (1.626 m).   Weight as of this encounter: 99.8 kg (220 lb). Up with therapy   Labs reviewed this AM. Minimal pain this AM. Pt has had a BM. Plan will be for discharge home today following morning session with PT.  DVT Prophylaxis - Lovenox, Foot Pumps and TED hose Weight-Bearing as tolerated to right leg  J. Cameron Proud, PA-C [facility] Orthopaedic Surgery 03/09/2018, 7:25 AM

## 2018-07-05 ENCOUNTER — Other Ambulatory Visit: Payer: Self-pay | Admitting: Family Medicine

## 2018-07-05 DIAGNOSIS — Z1231 Encounter for screening mammogram for malignant neoplasm of breast: Secondary | ICD-10-CM

## 2018-07-14 ENCOUNTER — Ambulatory Visit
Admission: RE | Admit: 2018-07-14 | Discharge: 2018-07-14 | Disposition: A | Discharge status: Home / Self Care | Payer: PRIVATE HEALTH INSURANCE | Source: Ambulatory Visit | Attending: Family Medicine | Admitting: Family Medicine

## 2018-07-14 DIAGNOSIS — Z1231 Encounter for screening mammogram for malignant neoplasm of breast: Secondary | ICD-10-CM | POA: Diagnosis present

## 2018-10-12 ENCOUNTER — Ambulatory Visit: Payer: PRIVATE HEALTH INSURANCE | Attending: Otolaryngology

## 2018-10-12 DIAGNOSIS — G4733 Obstructive sleep apnea (adult) (pediatric): Secondary | ICD-10-CM | POA: Insufficient documentation

## 2018-10-26 ENCOUNTER — Ambulatory Visit: Payer: PRIVATE HEALTH INSURANCE | Attending: Otolaryngology

## 2018-10-26 DIAGNOSIS — G4733 Obstructive sleep apnea (adult) (pediatric): Secondary | ICD-10-CM | POA: Insufficient documentation

## 2019-09-25 IMAGING — CR DG CHEST 2V
1 series · 2 of 2 positions shown · non-contrast
Comparison: None.

CLINICAL DATA: Preoperative evaluation for LEFT total knee
replacement, former smoker

EXAM:
CHEST - 2 VIEW

[Series 1: dg chest 2 view · 0.14mm/px · 2 of 2 slices shown]
[im 1/2]
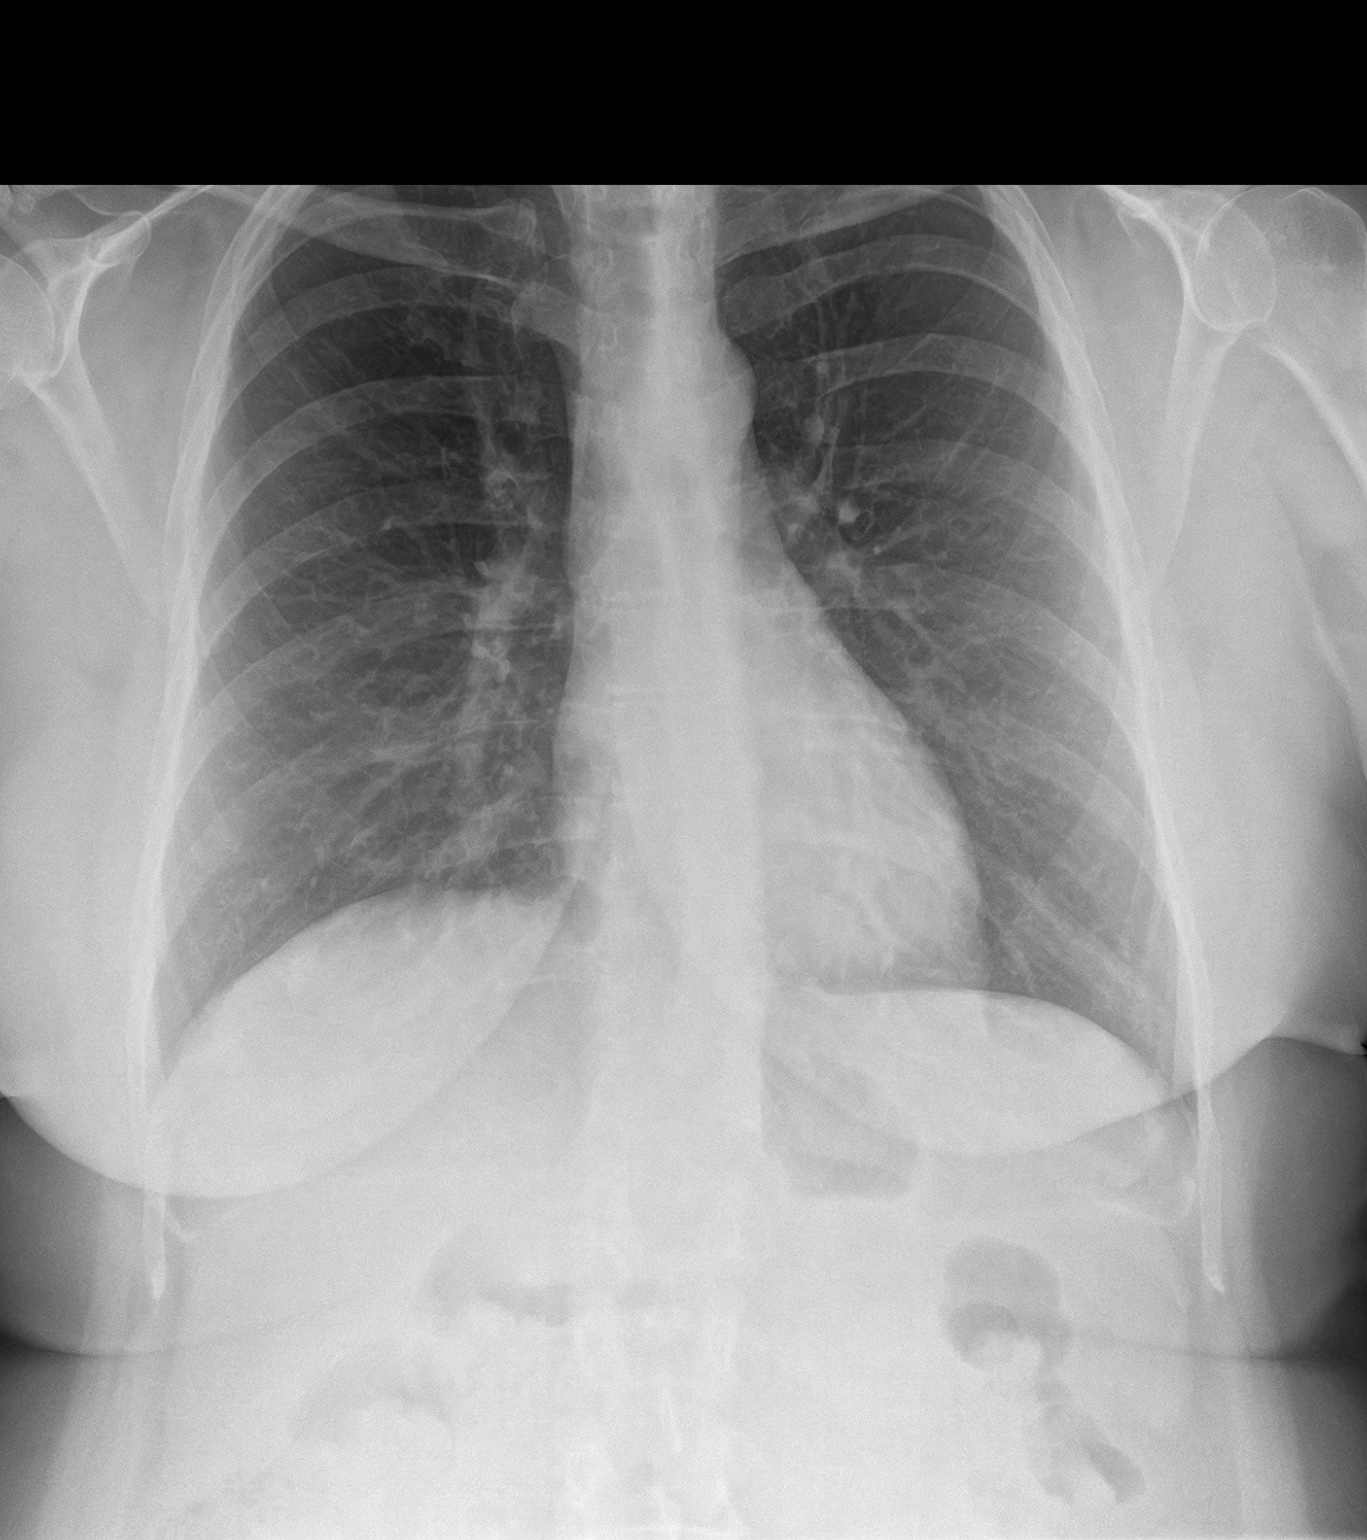
[im 2/2]
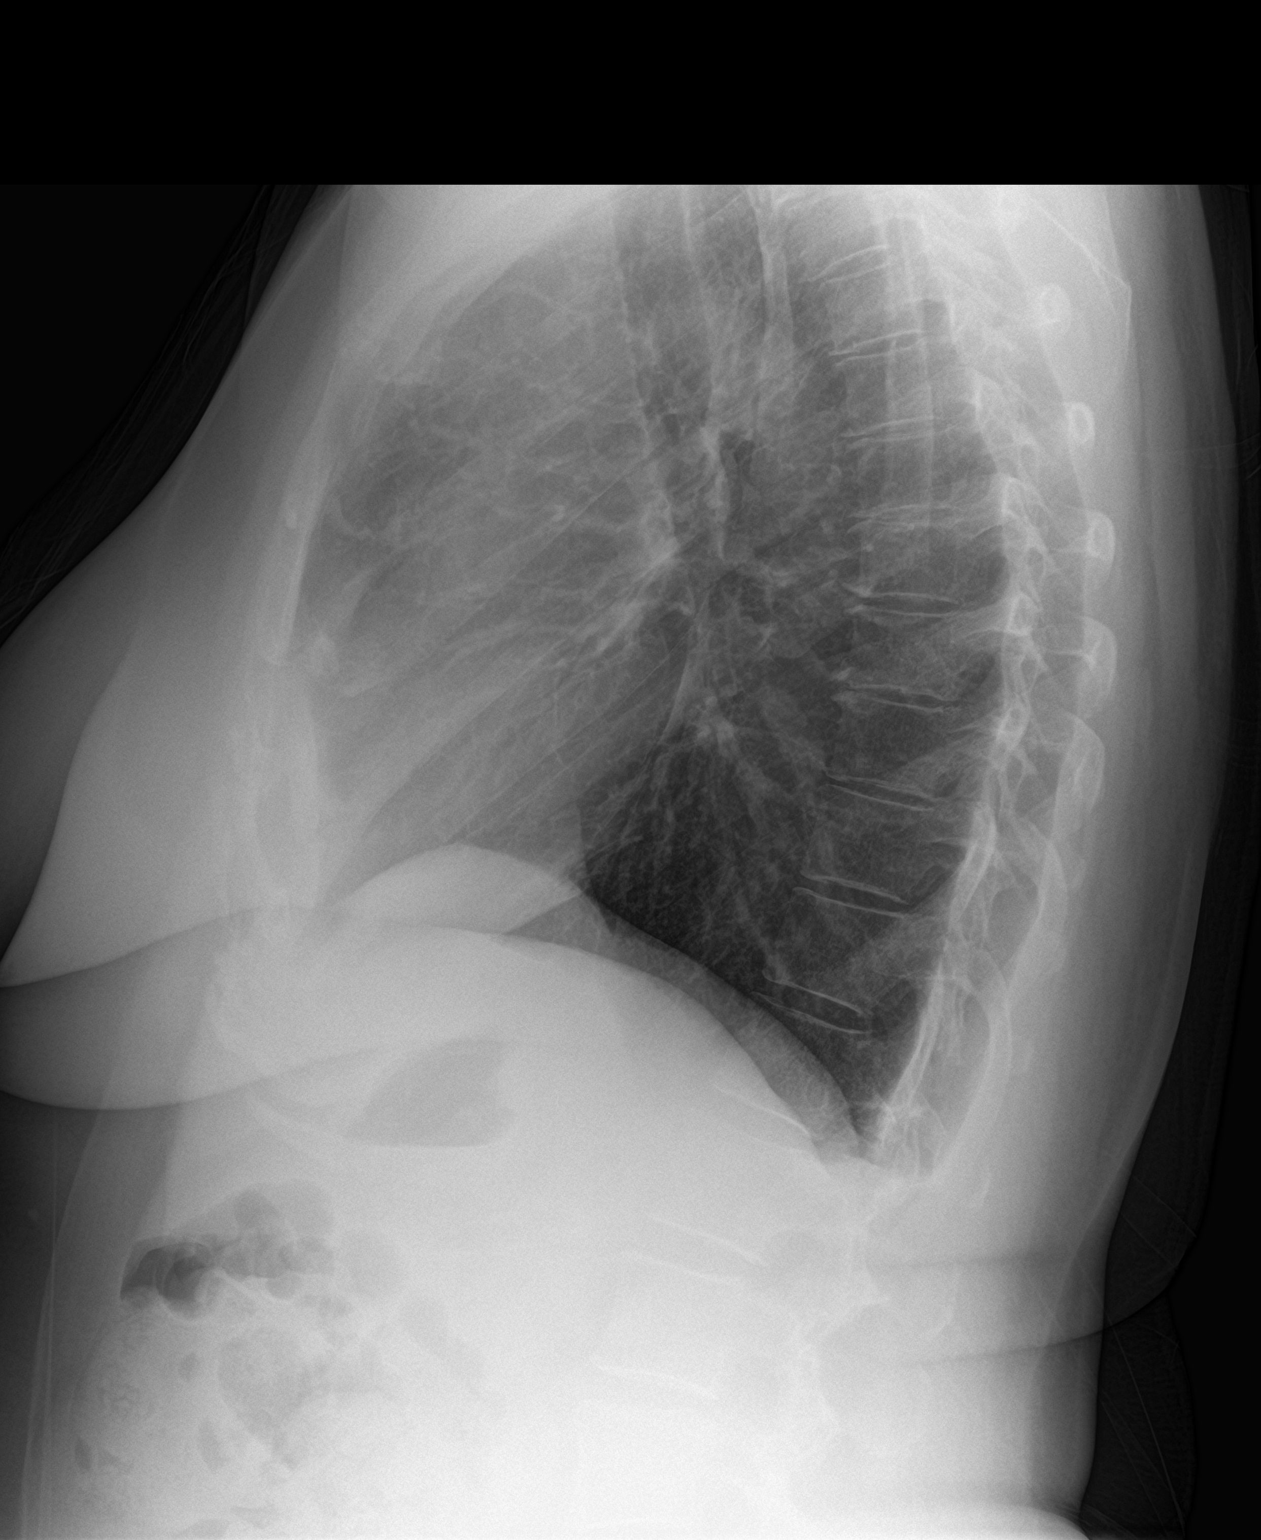

[2 of 2 positions shown; findings below may reference images not displayed]

FINDINGS: Normal heart size, mediastinal contours, and pulmonary vascularity.

Mild peribronchial thickening.

Lungs clear.

No pleural effusion or pneumothorax.

Bones unremarkable.
IMPRESSION: Mild bronchitic changes without infiltrate.

## 2019-10-08 IMAGING — DX DG KNEE 1-2V PORT*L*
2 series · 2 of 2 positions shown · non-contrast
Comparison: Preoperative MRI of the knee March 16, 2017

CLINICAL DATA: Status post left total knee joint prosthesis
placement.

EXAM:
PORTABLE LEFT KNEE - 1-2 VIEW

[knee ap]
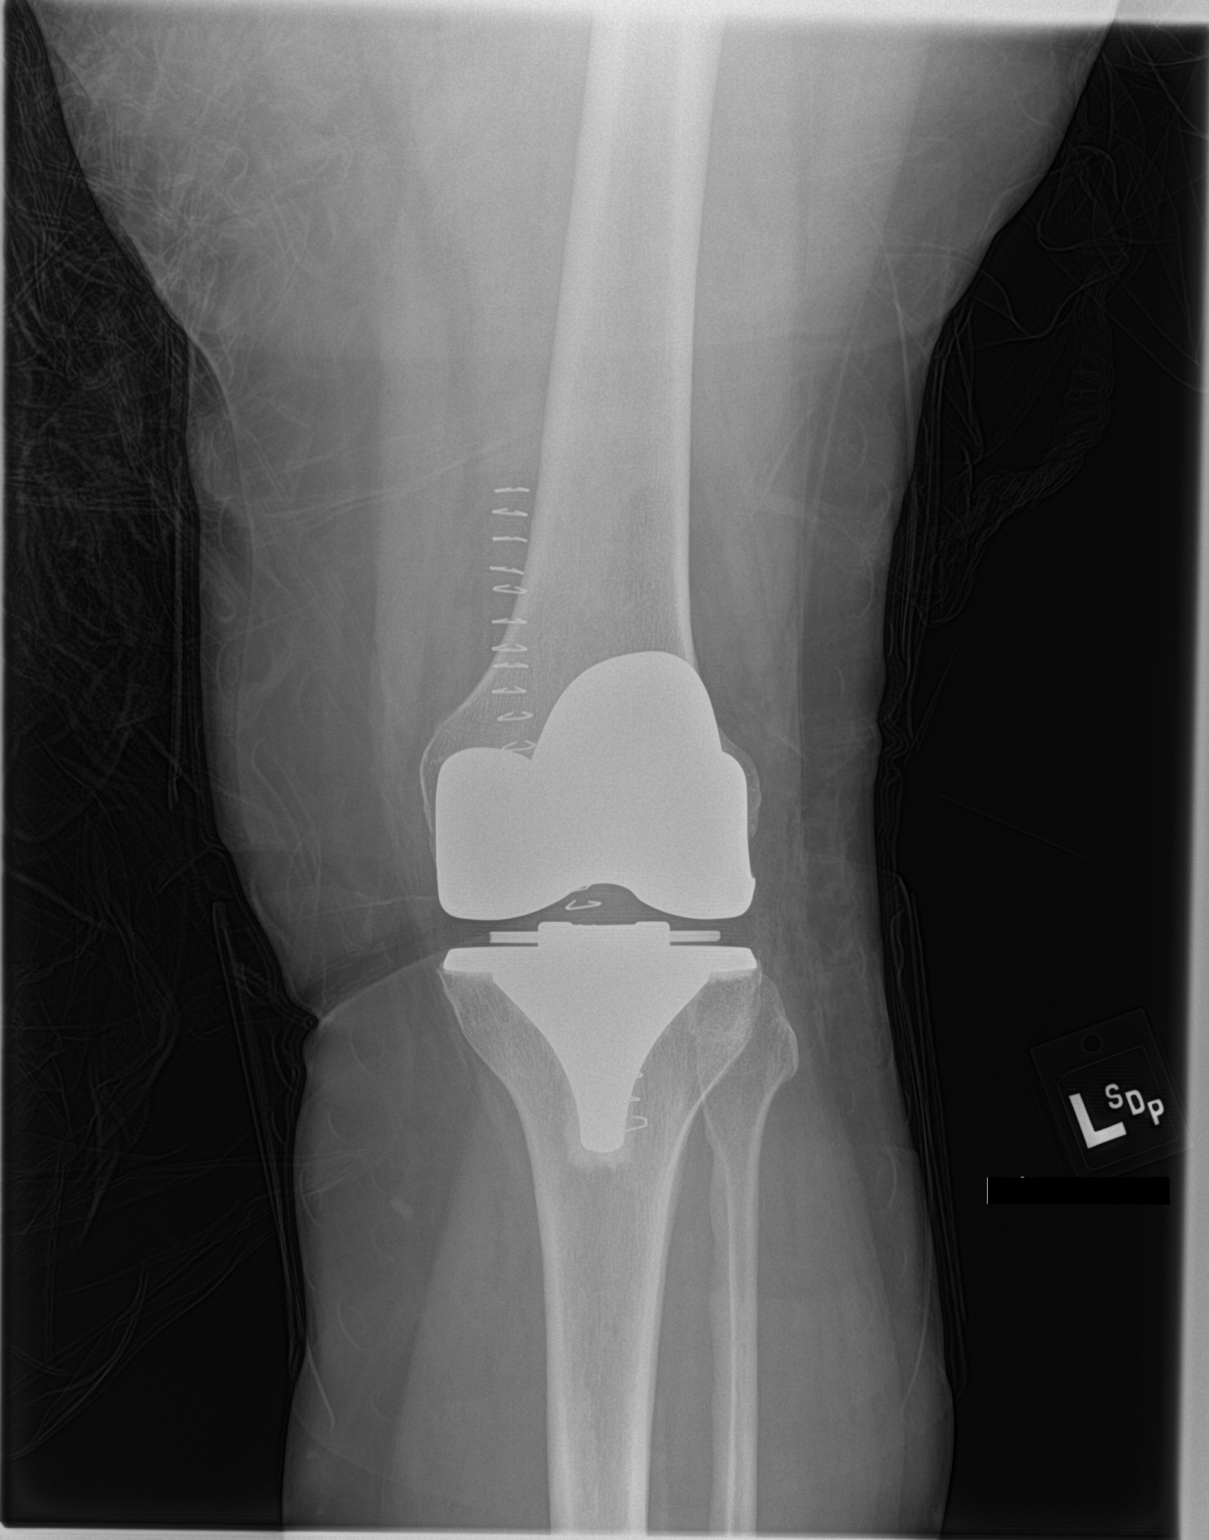

[knee lat]
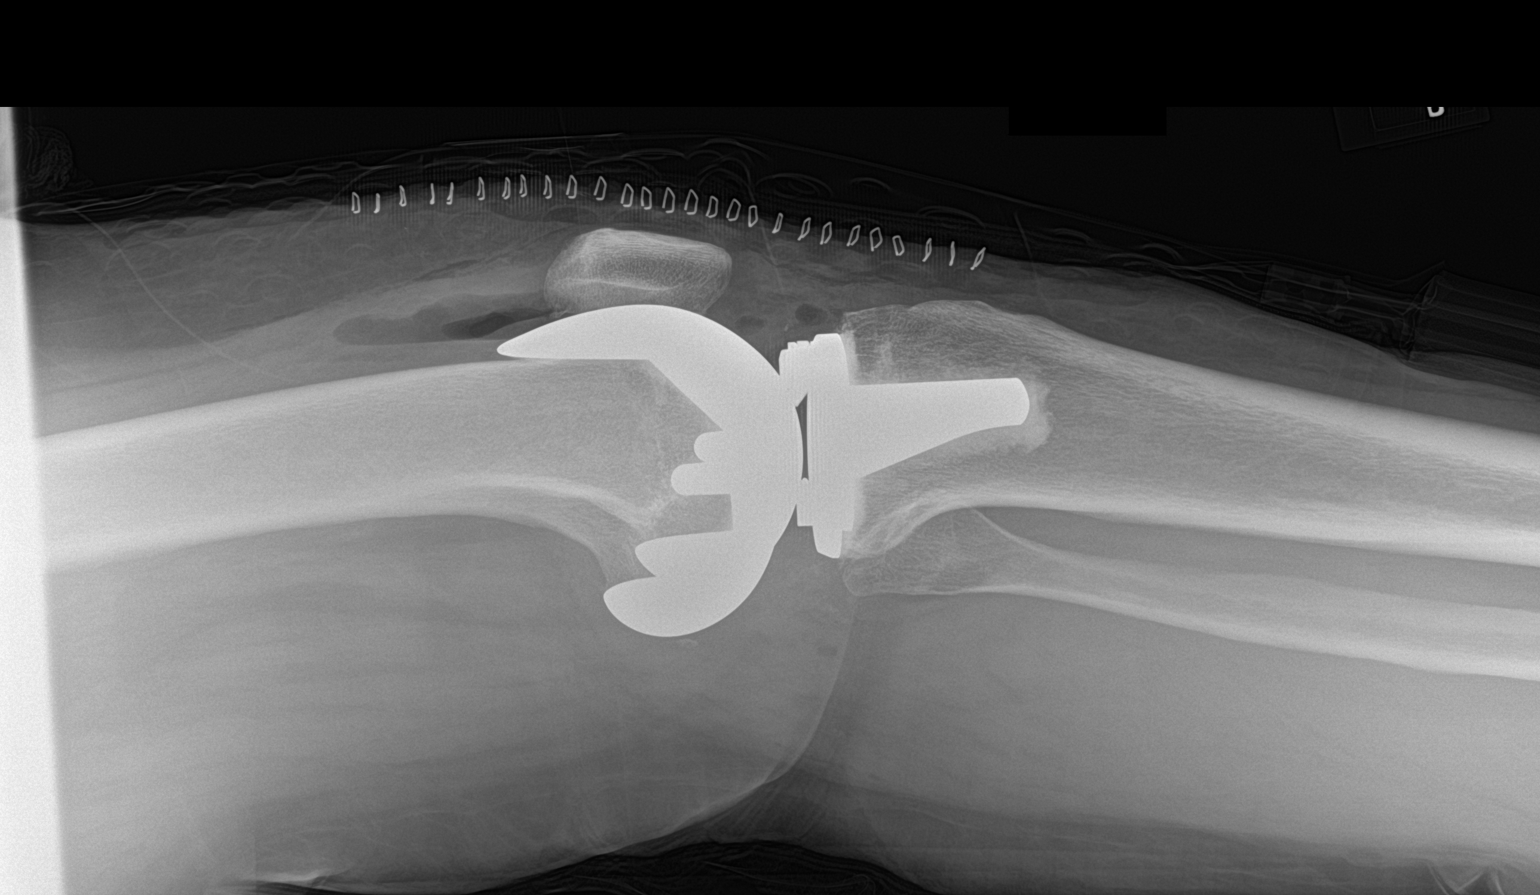

[2 of 2 positions shown; findings below may reference images not displayed]

FINDINGS: The patient has undergone total knee joint prosthesis placement.
Radiographic positioning of the prosthetic components is good. The
interface with the native bone appears normal. There is a small
amount of air in fluid in the anterior aspect of the joint space.
IMPRESSION: No immediate postprocedure complication following left total knee
joint prosthesis placement.

## 2020-09-30 ENCOUNTER — Other Ambulatory Visit: Payer: Self-pay | Admitting: Internal Medicine

## 2020-09-30 DIAGNOSIS — Z1231 Encounter for screening mammogram for malignant neoplasm of breast: Secondary | ICD-10-CM

## 2020-10-01 ENCOUNTER — Ambulatory Visit
Admission: RE | Admit: 2020-10-01 | Discharge: 2020-10-01 | Disposition: A | Discharge status: Home / Self Care | Payer: PRIVATE HEALTH INSURANCE | Source: Ambulatory Visit | Attending: Internal Medicine | Admitting: Internal Medicine

## 2020-10-01 ENCOUNTER — Other Ambulatory Visit: Payer: Self-pay

## 2020-10-01 DIAGNOSIS — Z1231 Encounter for screening mammogram for malignant neoplasm of breast: Secondary | ICD-10-CM | POA: Insufficient documentation

## 2021-01-09 ENCOUNTER — Other Ambulatory Visit: Payer: Self-pay | Admitting: Student

## 2021-01-09 ENCOUNTER — Ambulatory Visit
Admission: RE | Admit: 2021-01-09 | Discharge: 2021-01-09 | Disposition: A | Discharge status: Home / Self Care | Payer: PRIVATE HEALTH INSURANCE | Source: Ambulatory Visit | Attending: Student | Admitting: Student

## 2021-01-09 ENCOUNTER — Other Ambulatory Visit: Payer: Self-pay

## 2021-01-09 ENCOUNTER — Other Ambulatory Visit
Admission: RE | Admit: 2021-01-09 | Discharge: 2021-01-09 | Disposition: A | Discharge status: Home / Self Care | Payer: PRIVATE HEALTH INSURANCE | Source: Ambulatory Visit | Attending: Pediatrics | Admitting: Pediatrics

## 2021-01-09 DIAGNOSIS — R600 Localized edema: Secondary | ICD-10-CM | POA: Insufficient documentation

## 2021-01-09 DIAGNOSIS — R601 Generalized edema: Secondary | ICD-10-CM | POA: Diagnosis present

## 2021-01-09 LAB — BRAIN NATRIURETIC PEPTIDE: B Natriuretic Peptide: 10.3 pg/mL (ref 0.0–100.0)

## 2021-04-09 ENCOUNTER — Observation Stay
Admission: EM | Admit: 2021-04-09 | Discharge: 2021-04-10 | Disposition: A | Discharge status: Home / Self Care | Payer: PRIVATE HEALTH INSURANCE | Attending: Internal Medicine | Admitting: Internal Medicine

## 2021-04-09 ENCOUNTER — Emergency Department: Payer: PRIVATE HEALTH INSURANCE

## 2021-04-09 ENCOUNTER — Observation Stay: Payer: PRIVATE HEALTH INSURANCE

## 2021-04-09 DIAGNOSIS — Z96652 Presence of left artificial knee joint: Secondary | ICD-10-CM | POA: Insufficient documentation

## 2021-04-09 DIAGNOSIS — Z85828 Personal history of other malignant neoplasm of skin: Secondary | ICD-10-CM | POA: Diagnosis not present

## 2021-04-09 DIAGNOSIS — G459 Transient cerebral ischemic attack, unspecified: Principal | ICD-10-CM | POA: Insufficient documentation

## 2021-04-09 DIAGNOSIS — H539 Unspecified visual disturbance: Secondary | ICD-10-CM | POA: Diagnosis not present

## 2021-04-09 DIAGNOSIS — R200 Anesthesia of skin: Secondary | ICD-10-CM | POA: Diagnosis present

## 2021-04-09 DIAGNOSIS — R202 Paresthesia of skin: Secondary | ICD-10-CM | POA: Diagnosis not present

## 2021-04-09 DIAGNOSIS — I10 Essential (primary) hypertension: Secondary | ICD-10-CM | POA: Diagnosis not present

## 2021-04-09 DIAGNOSIS — Z20822 Contact with and (suspected) exposure to covid-19: Secondary | ICD-10-CM | POA: Diagnosis not present

## 2021-04-09 DIAGNOSIS — Z79899 Other long term (current) drug therapy: Secondary | ICD-10-CM | POA: Diagnosis not present

## 2021-04-09 DIAGNOSIS — R29818 Other symptoms and signs involving the nervous system: Secondary | ICD-10-CM | POA: Diagnosis not present

## 2021-04-09 DIAGNOSIS — Z87891 Personal history of nicotine dependence: Secondary | ICD-10-CM | POA: Diagnosis not present

## 2021-04-09 LAB — COMPREHENSIVE METABOLIC PANEL
ALT: 16 U/L (ref 0–44)
AST: 20 U/L (ref 15–41)
Albumin: 4.5 g/dL (ref 3.5–5.0)
Alkaline Phosphatase: 61 U/L (ref 38–126)
Anion gap: 8 (ref 5–15)
BUN: 15 mg/dL (ref 6–20)
CO2: 29 mmol/L (ref 22–32)
Calcium: 9.3 mg/dL (ref 8.9–10.3)
Chloride: 100 mmol/L (ref 98–111)
Creatinine, Ser: 0.93 mg/dL (ref 0.44–1.00)
GFR, Estimated: >60 mL/min (ref >60)
Glucose, Bld: 97 mg/dL (ref 70–99)
Potassium: 3.4 mmol/L — ABNORMAL LOW (ref 3.5–5.1)
Sodium: 137 mmol/L (ref 135–145)
Total Bilirubin: 1.1 mg/dL (ref 0.3–1.2)
Total Protein: 7.8 g/dL (ref 6.5–8.1)

## 2021-04-09 LAB — DIFFERENTIAL
Abs Immature Granulocytes: 0.03 10*3/uL (ref 0.00–0.07)
Basophils Absolute: 0.1 10*3/uL (ref 0.0–0.1)
Basophils Relative: 1 %
Eosinophils Absolute: 0.1 10*3/uL (ref 0.0–0.5)
Eosinophils Relative: 1 %
Immature Granulocytes: 0 %
Lymphocytes Relative: 19 %
Lymphs Abs: 1.5 10*3/uL (ref 0.7–4.0)
Monocytes Absolute: 0.6 10*3/uL (ref 0.1–1.0)
Monocytes Relative: 8 %
Neutro Abs: 5.6 10*3/uL (ref 1.7–7.7)
Neutrophils Relative %: 71 %

## 2021-04-09 LAB — CBC
HCT: 46.2 % — ABNORMAL HIGH (ref 36.0–46.0)
Hemoglobin: 16.2 g/dL — ABNORMAL HIGH (ref 12.0–15.0)
MCH: 29.3 pg (ref 26.0–34.0)
MCHC: 35.1 g/dL (ref 30.0–36.0)
MCV: 83.7 fL (ref 80.0–100.0)
Platelets: 261 10*3/uL (ref 150–400)
RBC: 5.52 MIL/uL — ABNORMAL HIGH (ref 3.87–5.11)
RDW: 11.8 % (ref 11.5–15.5)
WBC: 7.8 10*3/uL (ref 4.0–10.5)
nRBC: 0 % (ref 0.0–0.2)

## 2021-04-09 LAB — APTT: aPTT: 29 seconds (ref 24–36)

## 2021-04-09 LAB — PROTIME-INR
INR: 0.9 (ref 0.8–1.2)
Prothrombin Time: 12.6 seconds (ref 11.4–15.2)

## 2021-04-09 LAB — CBG MONITORING, ED: Glucose-Capillary: 94 mg/dL (ref 70–99)

## 2021-04-09 MED ORDER — ACETAMINOPHEN 160 MG/5ML PO SOLN
650.0000 mg | ORAL | Status: DC | PRN
  Filled 2021-04-09: qty 20.3

## 2021-04-09 MED ORDER — SIMVASTATIN 20 MG PO TABS
20.0000 mg | ORAL_TABLET | Freq: Every evening | ORAL | Status: DC
  Administered 2021-04-09: 20 mg via ORAL
  Filled 2021-04-09: qty 1

## 2021-04-09 MED ORDER — ACETAMINOPHEN 650 MG RE SUPP: 650.0000 mg | RECTAL | Status: DC | PRN

## 2021-04-09 MED ORDER — ENOXAPARIN SODIUM 60 MG/0.6ML IJ SOSY
50.0000 mg | PREFILLED_SYRINGE | INTRAMUSCULAR | Status: DC
  Administered 2021-04-09: 21:00:00 50 mg via SUBCUTANEOUS
  Filled 2021-04-09: qty 0.6

## 2021-04-09 MED ORDER — GADOBUTROL 1 MMOL/ML IV SOLN
10.0000 mL | Freq: Once | INTRAVENOUS | Status: AC | PRN
  Administered 2021-04-09: 10 mL via INTRAVENOUS

## 2021-04-09 MED ORDER — SODIUM CHLORIDE 0.9% FLUSH: 3.0000 mL | Freq: Once | INTRAVENOUS | Status: DC

## 2021-04-09 MED ORDER — CITALOPRAM HYDROBROMIDE 20 MG PO TABS
30.0000 mg | ORAL_TABLET | Freq: Every day | ORAL | Status: DC
  Administered 2021-04-10: 08:00:00 30 mg via ORAL
  Filled 2021-04-09: qty 2

## 2021-04-09 MED ORDER — ACETAMINOPHEN 325 MG PO TABS: 650.0000 mg | ORAL_TABLET | ORAL | Status: DC | PRN

## 2021-04-09 MED ORDER — ASPIRIN EC 81 MG PO TBEC
81.0000 mg | DELAYED_RELEASE_TABLET | Freq: Every day | ORAL | Status: DC
  Administered 2021-04-09 – 2021-04-10 (×2): 81 mg via ORAL
  Filled 2021-04-09 (×2): qty 1

## 2021-04-09 MED ORDER — STROKE: EARLY STAGES OF RECOVERY BOOK: Freq: Once | Status: DC

## 2021-04-09 MED ORDER — SODIUM CHLORIDE 0.9 % IV SOLN: INTRAVENOUS | Status: DC

## 2021-04-09 MED ORDER — SENNOSIDES-DOCUSATE SODIUM 8.6-50 MG PO TABS: 1.0000 | ORAL_TABLET | Freq: Every evening | ORAL | Status: DC | PRN

## 2021-04-09 NOTE — Consult Note (Signed)
NEUROLOGY CONSULTATION NOTE   Date of service: April 09, 2021 Patient Name: Isabella Luna MRN:  [id number] DOB:  May 13, 1961 Reason for consult: blurry vision and acute on chronic RUE paresthesias Requesting physician: Nance Pear MD _ _ _   _ __   _ __ _ _  __ __   _ __   __ _  History of Present Illness   Isabella Luna is a 60 y.o. female with hx arthritis and chronic R shoulder bursitis with intermittent RUE paresthesias who presents to ED with worsening RUE paresthesias and binocular blurry vision. LKW 0930 today. She has had intermittent RUE paresthesias over past few days that come and go and per orthopedic surgeon were most likely related to known R shoulder bursitis. After 0930 they have remained constant while usually they come and go. Also she developed difficulty focusing her eyes with both eyes open, when covering either eye she sees normally. No frank diplopia. NIHSS = 1 for minimal RLE drift. CTH NAICP. tPA not administered 2/2 mild sx. CTA not performed 2/2 exam not c/w LVO.    ROS   Per HPI; all other systems reviewed and were negative  Past History   Past Medical History:  Diagnosis Date  . Arthritis   . Cancer ([place])    skin  . GERD (gastroesophageal reflux disease)   . Mood changes   . PONV (postoperative nausea and vomiting)    after Hysterectomy   . Wears hearing aid in both ears    Past Surgical History:  Procedure Laterality Date  . ABDOMINAL HYSTERECTOMY    . BLADDER SUSPENSION    . BUNIONECTOMY Bilateral   . COLONOSCOPY WITH ESOPHAGOGASTRODUODENOSCOPY (EGD)    . KNEE ARTHROSCOPY WITH MEDIAL MENISECTOMY Left 05/04/2017   Procedure: Arthroscopic medial meniscus repair and arthroscopic partial lateral meniscectomy, right knee. ;  Surgeon: Corky Mull, MD;  Location: [place];  Service: Orthopedics;  Laterality: Left;  . TOTAL KNEE ARTHROPLASTY Left 03/07/2018   Procedure: TOTAL KNEE ARTHROPLASTY;  Surgeon: Corky Mull, MD;  Location: [facility];   Service: Orthopedics;  Laterality: Left;   Family History  Problem Relation Age of Onset  . Breast cancer Neg Hx    Social History   Socioeconomic History  . Marital status: Married    Spouse name: Not on file  . Number of children: Not on file  . Years of education: Not on file  . Highest education level: Not on file  Occupational History  . Not on file  Tobacco Use  . Smoking status: Former    Pack years: 0.00    Types: Cigarettes    Quit date: 04/1992    Years since quitting: 29.0  . Smokeless tobacco: Never  Vaping Use  . Vaping Use: Never used  Substance and Sexual Activity  . Alcohol use: Yes    Comment: rare  . Drug use: Never  . Sexual activity: Not on file  Other Topics Concern  . Not on file  Social History Narrative  . Not on file   Social Determinants of Health   Financial Resource Strain: Not on file  Food Insecurity: Not on file  Transportation Needs: Not on file  Physical Activity: Not on file  Stress: Not on file  Social Connections: Not on file   Allergies  Allergen Reactions  . Cortisone Other (See Comments)    Facial redness, insomnia    Medications   (Not in a hospital admission)  Vitals   Vitals:   04/09/21 1113 04/09/21 1130 04/09/21 1145 04/09/21 1215  BP: (!) 150/73 (!) 153/78 125/64 124/75  Pulse: 61 63 67 61  Resp: 15 12 17 17   Temp:      TempSrc:      SpO2: 94% 100% 94% 97%  Weight:      Height:         Body mass index is 38.87 kg/m.  Physical Exam   Physical Exam Gen: A&O x4, NAD HEENT: Atraumatic, normocephalic;mucous membranes moist; oropharynx clear, tongue without atrophy or fasciculations. Neck: Supple, trachea midline. Resp: CTAB, no w/r/r CV: RRR, no m/g/r; nml S1 and S2. 2+ symmetric peripheral pulses. Abd: soft/NT/ND; nabs x 4 quad Extrem: Nml bulk; no cyanosis, clubbing, or edema.  Neuro: *MS: A&O x4. Follows multi-step commands.  *Speech: fluid, nondysarthric, able to name and repeat *CN:     I: Deferred   II,III: PERRLA, VFF by confrontation, optic discs sharp   III,IV,VI: EOMI w/o nystagmus, no ptosis   V: Sensation intact from V1 to V3 to LT   VII: Eyelid closure was full.  Smile symmetric.   VIII: Hearing intact to voice   IX,X: Voice normal, palate elevates symmetrically    XI: SCM/trap 5/5 bilat   XII: Tongue protrudes midline, no atrophy or fasciculations   *Motor:   Normal bulk.  No tremor, rigidity or bradykinesia. No pronator drift BUE, v subtle RLE drift.    Strength: Dlt Bic Tri WrE WrF FgS Gr HF KnF KnE PlF DoF    Left 5 5 5 5 5 5 5 5 5 5 5 5     Right 5 5 5 5 5 5 5 5 5 5 5 5     *Sensory: Intact to light touch, pinprick, temperature vibration throughout. Symmetric. Propioception intact bilat.  No double-simultaneous extinction.  *Coordination:  Finger-to-nose, heel-to-shin, rapid alternating motions were intact. *Reflexes:  2+ and symmetric throughout without clonus; toes down-going bilat *Gait: deferred  NIHSS = 1 for RLE drift  Premorbid mRS = 0   Labs   CBC:  Recent Labs  Lab 04/09/21 1100  WBC 7.8  NEUTROABS 5.6  HGB 16.2*  HCT 46.2*  MCV 83.7  PLT 001    Basic Metabolic Panel:  Lab Results  Component Value Date   NA 137 04/09/2021   K 3.4 (L) 04/09/2021   CO2 29 04/09/2021   GLUCOSE 97 04/09/2021   BUN 15 04/09/2021   CREATININE 0.93 04/09/2021   CALCIUM 9.3 04/09/2021   GFRNONAA >60 04/09/2021   GFRAA >60 03/09/2018   Lipid Panel: No results found for: LDLCALC HgbA1c: No results found for: HGBA1C Urine Drug Screen: No results found for: LABOPIA, COCAINSCRNUR, LABBENZ, AMPHETMU, THCU, LABBARB  Alcohol Level No results found for: ETH   Impression   Isabella Luna is a 60 y.o. female with hx arthritis and chronic R shoulder bursitis with intermittent RUE paresthesias who presents to ED with worsening RUE paresthesias and binocular blurry vision. I suspect the vision abnl she is describing is actually a mild binocular diplopia  since it resolves by closing either eye. She does also have RLE drift on exam therefore admission for stroke/TIA workup is warranted. RUE paresthesias may be related to current presentation or 2/2 known chronic bursitis of ipsilateral shoulder. tPA not indicated 2/2 mild sx. Recommend observation in ED with close monitoring until she is outside window for tPA. After that if no change in exam may be admitted to hospitalist service for  stroke/TIA workup.   Recommendations   - Monitor in ED until outside tPA window (@ 1400) w/ vitals q 15 min and neurochecks q 30 min - If no change in exam by 1400, administer 325mg  ASA x1 and admit to hospitalist service for stroke w/u. Aspirin should be continued at 81mg  daily after that. Do not give ASA while patient is still in tPA window.  - Permissive HTN x48 hrs from sx onset or until stroke ruled out by MRI goal BP <220/110. PRN labetalol or hydralazine if BP above these parameters. Avoid oral antihypertensives. - MRI brain wo contrast - MRA H&N - TTE - Check A1c and LDL + add statin per guidelines - q4 hr neuro checks - STAT head CT for any change in neuro exam - Tele - PT/OT/SLP - Stroke education - Amb referral to neurology upon discharge   Will continue to follow.  ______________________________________________________________________   Thank you for the opportunity to take part in the care of this patient. If you have any further questions, please contact the neurology consultation attending.  Signed,  Su Monks, MD [facility] [phone]  If 7pm- 7am, please page neurology on call as listed in [place].

## 2021-04-09 NOTE — Progress Notes (Signed)
PT Cancellation Note  Patient Details Name: BRITTANIA SUDBECK MRN: [id number] DOB: 09-30-1961   Cancelled Treatment:    Reason Eval/Treat Not Completed: PT screened, no needs identified, will sign off. Per RN patient is ambulating without difficulty. No need for PT evaluation at this time. Will sign off. Please re-consult if the need arises.      Trysten Bernard 04/09/2021, 3:24 PM

## 2021-04-09 NOTE — ED Notes (Signed)
Pt in MRI.

## 2021-04-09 NOTE — ED Provider Notes (Signed)
[facility] Emergency Department Provider Note    ____________________________________________   I have reviewed the triage vital signs and the nursing notes.   HISTORY  Chief Complaint Code Stroke   History limited by: Not Limited   HPI Isabella Luna is a 60 y.o. female who presents to the emergency department today because of concern for vision change and right arm numbness/tingling. In terms of the right arm issue she states that this has been going on and off for roughly 1 month. Did see orthopedics and they believe it is related to some bursitis. Today however it was constant. She also felt like she was having some difficulty with her vision. The patient denies any headache. The patient denies any head trauma. Denies similar vision symptoms in the past. Her vision symptoms started roughly around 930 today.  Records reviewed. Per medical record review patient has a history of recent right shoulder issues, has been seen by Dr. Marry Guan with orthopedics.   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 ;  Surgeon: Corky Mull, MD;  Location: [place];  Service: Orthopedics;  Laterality: Left;   TOTAL KNEE ARTHROPLASTY Left 03/07/2018   Procedure: TOTAL KNEE ARTHROPLASTY;  Surgeon: Corky Mull, MD;  Location: [facility];  Service: Orthopedics;  Laterality: Left;    Prior to Admission medications   Medication Sig Start Date End Date Taking? Authorizing Provider  citalopram (CELEXA) 20 MG tablet Take 30 mg by mouth daily.    [provider]  enoxaparin (LOVENOX) 40 MG/0.4ML injection Inject 0.4 mLs (40 mg total) into the skin daily. 03/09/18   Lattie Corns, PA-C  naproxen sodium (ALEVE) 220 MG tablet Take 440 mg by mouth 2 (two) times daily as needed (for pain.).    [provider]  oxyCODONE (OXY IR/ROXICODONE) 5 MG immediate release tablet Take 1-2 tablets (5-10 mg total) by mouth every 4 (four) hours as needed for moderate pain. 03/09/18   Lattie Corns, PA-C  pseudoephedrine (SUDAFED) 30 MG tablet Take 30 mg by mouth every 4 (four) hours as needed for congestion.    [provider]  simvastatin (ZOCOR) 20 MG tablet Take 20 mg by mouth daily.    [provider]  traMADol (ULTRAM) 50 MG tablet Take 1-2 tablets (50-100 mg total) by mouth every 6 (six) hours as needed for moderate pain. 03/09/18   Lattie Corns, PA-C  valACYclovir (VALTREX) 1000 MG tablet Take 2 g by mouth 2 (two) times daily as needed (for cold sores).     [provider]    Allergies Cortisone  Family History  Problem Relation Age of Onset   Breast cancer Neg Hx     Social History Social History   Tobacco  Use   Smoking status: Former    Pack years: 0.00    Types: Cigarettes    Quit date: 04/1992    Years since quitting: 29.0   Smokeless tobacco: Never  Vaping Use   Vaping Use: Never used  Substance Use Topics   Alcohol use: Yes    Comment: rare   Drug use: Never    Review of Systems Constitutional: No fever/chills Eyes: Positive for vision change.  ENT: No sore throat. Cardiovascular: Denies chest pain. Respiratory: Denies shortness of breath. Gastrointestinal: No abdominal pain.  No nausea, no vomiting.  No diarrhea.   Genitourinary:  Negative for dysuria. Musculoskeletal: Positive for right arm tingling.  Skin: Negative for rash. Neurological: Negative for headaches, focal weakness or numbness.  ____________________________________________   PHYSICAL EXAM:  VITAL SIGNS: ED Triage Vitals [04/09/21 1057]  Enc Vitals Group     BP (!) 151/74     Pulse Rate 69     Resp 18     Temp 98.5 F (36.9 C)     Temp Source Oral     SpO2 95 %     Weight 230 lb (104.3 kg)     Height 5' 4.5" (1.638 m)   Constitutional: Alert and oriented.  Eyes: Conjunctivae are normal.  ENT      Head: Normocephalic and atraumatic.      Nose: No congestion/rhinnorhea.      Mouth/Throat: Mucous membranes are moist.      Neck: No stridor. Hematological/Lymphatic/Immunilogical: No cervical lymphadenopathy. Cardiovascular: Normal rate, regular rhythm.  No murmurs, rubs, or gallops.  Respiratory: Normal respiratory effort without tachypnea nor retractions. Breath sounds are clear and equal bilaterally. No wheezes/rales/rhonchi. Gastrointestinal: Soft and non tender. No rebound. No guarding.  Genitourinary: Deferred Musculoskeletal: Normal range of motion in all extremities. No lower extremity edema. Neurologic:  Normal speech and language. No gross focal neurologic deficits are appreciated.  Skin:  Skin is warm, dry and intact. No rash noted. Psychiatric: Mood and affect are normal. Speech and behavior are normal. Patient exhibits appropriate insight and judgment.  ____________________________________________    LABS (pertinent positives/negatives)  CBC wbc 7.8, hgb 16.2, plt 261 CMP wnl except k 3.4 ____________________________________________   EKG  I, Nance Pear, attending physician, personally viewed and interpreted this EKG  EKG Time: 1119 Rate: 69 Rhythm: sinus rhythm Axis: normal Intervals: qtc 431 QRS: narrow ST changes: no st elevation Impression: normal ekg  ____________________________________________     RADIOLOGY  CT head No acute abnormality  ____________________________________________   PROCEDURES  Procedures  ____________________________________________   INITIAL IMPRESSION / ASSESSMENT AND PLAN / ED COURSE  Pertinent labs & imaging results that were available during my care of the patient were reviewed by me and considered in my medical decision making (see chart for details).   Patient presented to the ER today because of concern for vision change and right arm tingling. Was Code Stroke. Dr. Quinn Axe with neurology did evaluate the patient. Did not recommend TPA with initial symptoms. Did recommend ER obs during TPA window and patient did not have any worsening or new symptoms. Patient will be admitted to the hospitalist service for further work up and evaluation.   ____________________________________________   FINAL CLINICAL IMPRESSION(S) / ED DIAGNOSES  Final diagnoses:  Vision changes  Arm paresthesia, right     Note: This dictation was prepared with Dragon dictation. Any transcriptional errors that result from this process are unintentional     Nance Pear, MD 04/09/21 1433

## 2021-04-09 NOTE — H&P (Signed)
[place]   PATIENT NAME: Isabella Luna    MR#:  [id number]  DATE OF BIRTH:  1961/05/31  DATE OF ADMISSION:  04/09/2021  PRIMARY CARE PHYSICIAN: Rusty Aus, MD   Patient is coming from: Home  REQUESTING/REFERRING PHYSICIAN: Nance Pear, MD  CHIEF COMPLAINT:   Chief Complaint  Patient presents with  . Code Stroke    HISTORY OF PRESENT ILLNESS:  Isabella Luna is a 60 y.o. Caucasian female with medical history significant for GERD, osteoarthritis, chronic right shoulder bursitis, HTN, dyslipidemia and depression, who presented to the emergency room with acute onset of right arm numbness and tingling with associated blurry vision that started earlier today.  She denies any tinnitus or vertigo.  No other paresthesias or focal muscle weakness.  No dysphagia or dysarthria.  No urinary or stool incontinence.  No witnessed seizures.  She denies any chest pain or palpitations.  No cough or wheezing or dyspnea.  The patient was outside the window for tPA.  ED Course: Upon presentation to the ER blood pressure was 151/74 with otherwise normal vital signs.  Labs revealed mild hypokalemia with otherwise unremarkable CMP.  CBC showed hemoconcentration.  EKG as reviewed by me : Showedsinus rhythm with a rate of 69 with slightly poor R wave progression Imaging: Noncontrasted head CT scan revealed no acute intracranial normalities.  The patient was seen by Dr. Quinn Axe in the ER.  The patient will be admitted to an observation medically monitored bed for further evaluation and management. PAST MEDICAL HISTORY:   Past Medical History:  Diagnosis Date  . Arthritis   . Cancer ([place])    skin  . GERD (gastroesophageal reflux disease)   . Mood changes   . PONV (postoperative nausea and vomiting)    after Hysterectomy   . Wears hearing aid in both ears     PAST SURGICAL HISTORY:   Past Surgical History:  Procedure Laterality Date  . ABDOMINAL HYSTERECTOMY    . BLADDER SUSPENSION     . BUNIONECTOMY Bilateral   . COLONOSCOPY WITH ESOPHAGOGASTRODUODENOSCOPY (EGD)    . KNEE ARTHROSCOPY WITH MEDIAL MENISECTOMY Left 05/04/2017   Procedure: Arthroscopic medial meniscus repair and arthroscopic partial lateral meniscectomy, right knee. ;  Surgeon: Corky Mull, MD;  Location: [place];  Service: Orthopedics;  Laterality: Left;  . TOTAL KNEE ARTHROPLASTY Left 03/07/2018   Procedure: TOTAL KNEE ARTHROPLASTY;  Surgeon: Corky Mull, MD;  Location: [facility];  Service: Orthopedics;  Laterality: Left;    SOCIAL HISTORY:   Social History   Tobacco Use  . Smoking status: Former    Pack years: 0.00    Types: Cigarettes    Quit date: 04/1992    Years since quitting: 29.0  . Smokeless tobacco: Never  Substance Use Topics  . Alcohol use: Yes    Comment: rare    FAMILY HISTORY:   Family History  Problem Relation Age of Onset  . Breast cancer Neg Hx     DRUG ALLERGIES:   Allergies  Allergen Reactions  . Cortisone Other (See Comments)    Facial redness, insomnia    REVIEW OF SYSTEMS:   ROS As per history of present illness. All pertinent systems were reviewed above. Constitutional, HEENT, cardiovascular, respiratory, GI, GU, musculoskeletal, neuro, psychiatric, endocrine, integumentary and hematologic systems were reviewed and are otherwise negative/unremarkable except for positive findings mentioned above in the HPI.   MEDICATIONS AT HOME:   Prior to Admission medications  Medication Sig Start Date End Date Taking? Authorizing Provider  citalopram (CELEXA) 20 MG tablet Take 30 mg by mouth daily.    [provider]  enoxaparin (LOVENOX) 40 MG/0.4ML injection Inject 0.4 mLs (40 mg total) into the skin daily. 03/09/18   Lattie Corns, PA-C  naproxen sodium (ALEVE) 220 MG tablet Take 440 mg by mouth 2 (two) times daily as needed (for pain.).    [provider]  oxyCODONE (OXY IR/ROXICODONE) 5 MG immediate release tablet Take 1-2  tablets (5-10 mg total) by mouth every 4 (four) hours as needed for moderate pain. 03/09/18   Lattie Corns, PA-C  pseudoephedrine (SUDAFED) 30 MG tablet Take 30 mg by mouth every 4 (four) hours as needed for congestion.    [provider]  simvastatin (ZOCOR) 20 MG tablet Take 20 mg by mouth daily.    [provider]  traMADol (ULTRAM) 50 MG tablet Take 1-2 tablets (50-100 mg total) by mouth every 6 (six) hours as needed for moderate pain. 03/09/18   Lattie Corns, PA-C  valACYclovir (VALTREX) 1000 MG tablet Take 2 g by mouth 2 (two) times daily as needed (for cold sores).     [provider]      VITAL SIGNS:  Blood pressure (!) 141/67, pulse 67, temperature 98.5 F (36.9 C), temperature source Oral, resp. rate 18, height 5' 4.5" (1.638 m), weight 104.3 kg, SpO2 97 %.  PHYSICAL EXAMINATION:  Physical Exam  GENERAL:  60 y.o.-year-old Caucasian female patient lying in the bed with no acute distress.  EYES: Pupils equal, round, reactive to light and accommodation. No scleral icterus. Extraocular muscles intact.  HEENT: Head atraumatic, normocephalic. Oropharynx and nasopharynx clear.  NECK:  Supple, no jugular venous distention. No thyroid enlargement, no tenderness.  LUNGS: Normal breath sounds bilaterally, no wheezing, rales,rhonchi or crepitation. No use of accessory muscles of respiration.  CARDIOVASCULAR: Regular rate and rhythm, S1, S2 normal. No murmurs, rubs, or gallops.  ABDOMEN: Soft, nondistended, nontender. Bowel sounds present. No organomegaly or mass.  EXTREMITIES: No pedal edema, cyanosis, or clubbing.  NEUROLOGIC: Cranial nerves II through XII are intact. Muscle strength 5/5 in all extremities. Sensation intact. Gait not checked.  PSYCHIATRIC: The patient is alert and oriented x 3.  Normal affect and good eye contact. SKIN: No obvious rash, lesion, or ulcer.   LABORATORY PANEL:   CBC Recent Labs  Lab 04/09/21 1100  WBC 7.8  HGB 16.2*   HCT 46.2*  PLT 261   ------------------------------------------------------------------------------------------------------------------  Chemistries  Recent Labs  Lab 04/09/21 1100  NA 137  K 3.4*  CL 100  CO2 29  GLUCOSE 97  BUN 15  CREATININE 0.93  CALCIUM 9.3  AST 20  ALT 16  ALKPHOS 61  BILITOT 1.1   ------------------------------------------------------------------------------------------------------------------  Cardiac Enzymes No results for input(s): TROPONINI in the last 168 hours. ------------------------------------------------------------------------------------------------------------------  RADIOLOGY:  CT HEAD CODE STROKE WO CONTRAST  Result Date: 04/09/2021 CLINICAL DATA:  Code stroke. Right arm numbness and weakness with blurred vision EXAM: CT HEAD WITHOUT CONTRAST TECHNIQUE: Contiguous axial images were obtained from the base of the skull through the vertex without intravenous contrast. COMPARISON:  None. FINDINGS: Brain: No evidence of acute infarction, hemorrhage, hydrocephalus, extra-axial collection or mass lesion/mass effect. Vascular: No hyperdense vessel or unexpected calcification. Skull: Normal. Negative for fracture or focal lesion. Sinuses/Orbits: No acute finding. Other: These results were called by telephone at the time of interpretation on 04/09/2021 at 11:13 am to provider [facility] ,  who verbally acknowledged these results. ASPECTS [facility] Stroke Program Early CT Score) - Ganglionic level infarction (caudate, lentiform nuclei, internal capsule, insula, M1-M3 cortex): 7 - Supraganglionic infarction (M4-M6 cortex): 3 Total score (0-10 with 10 being normal): 10 IMPRESSION: Negative head CT. Electronically Signed   By: Monte Fantasia M.D.   On: 04/09/2021 11:14      IMPRESSION AND PLAN:  Active Problems:   TIA (transient ischemic attack)  1.  Right upper extremity paresthesia and blurred vision with binocular diplopia, possibly TIA, rule out  evolving CVA. - The patient will be admitted to an observation medical monitored bed. - Neurochecks every 4 hours for 24 hours. - She will be placed on liquid baby aspirin. - Permissive hypertension. - Brain MRI without contrast and head and neck MRA. - Neurology follow-up consult. - Dr. Quinn Axe is aware about the patient. - We will check fasting lipids and hemoglobin A1c. - We will place her on statin therapy.  2.  Essential hypertension. - We will continue her antihypertensives with permissive parameters.  3.  Dyslipidemia. - We will check fasting lipids and continue her statin therapy.  4.  Depression. - We will continue her Celexa.  5.  GERD. - PPI therapy will be resumed.  DVT prophylaxis: Lovenox.  Code Status: full code.  Family Communication:  The plan of care was discussed in details with the patient (and no family members were in the room). I answered all questions. The patient agreed to proceed with the above mentioned plan. Further management will depend upon hospital course. Disposition Plan: Back to previous home environment Consults called: Neurology.   All the records are reviewed and case discussed with ED provider.  Status is: Observation  The patient remains OBS appropriate and will d/c before 2 midnights.  Dispo: The patient is from: Home              Anticipated d/c is to: Home              Patient currently is not medically stable to d/c.   Difficult to place patient No   TOTAL TIME TAKING CARE OF THIS PATIENT: 55 minutes.    Christel Mormon M.D on 04/09/2021 at 2:21 PM  [facility]   From 7 PM-7 AM, contact night-coverage [email or web address]  CC: Primary care physician; Rusty Aus, MD

## 2021-04-09 NOTE — ED Notes (Signed)
Pt resting comfortably, given diet coke. Denies further needs at this time. Stretcher locked in low position with side rails up x2, call light and personal belongings in reach.

## 2021-04-09 NOTE — ED Notes (Signed)
CODE STROKE called to [facility] spoke with [facility]

## 2021-04-09 NOTE — Progress Notes (Signed)
   04/09/21 1100  Clinical Encounter Type  Visited With Patient  Visit Type Initial  Referral From Chaplain  Consult/Referral To Nurse  Advance Directives (For Healthcare)  Does Patient Have a Medical Advance Directive? No  Would patient like information on creating a medical advance directive? No - Patient declined  [place]  Does Patient Have a Mental Health Advance Directive? No  Would patient like information on creating a mental health advance directive? No - Patient declined   Andee Poles responded to a code stroke page. Chaplain ministered with presence. Chaplain notified staff to contact chaplain if needed.

## 2021-04-09 NOTE — ED Notes (Signed)
Dr Quinn Axe at bedside, cancelled code stroke - will monitor patient in ED until she is out of TPA window - aprox 2pm.

## 2021-04-09 NOTE — ED Triage Notes (Signed)
Pt comes into the ED via POV c/o numbness and heaviness of the right arm and blurred vision that started at 10:15.  Pt states LKW was at 8:45.  Code stroke activated.

## 2021-04-09 NOTE — Consult Note (Signed)
CODE STROKE- PHARMACY COMMUNICATION  Time CODE STROKE called/page received:1105 Time response to CODE STROKE was made (in person): 1110 Time Stroke Kit retrieved from [place] (only if needed):N/A D/t mildness of symptoms during neuro review (NIHSS of 1; minimal RLE drift) no tPA was administered. However; since LKW was 0930; extended phase monitoring was provided until ~1400 today.  Name of Provider/Nurse contacted: Dr. Quinn Axe (neuro)  Past Medical History:  Diagnosis Date   Arthritis    Cancer ([place])    skin   GERD (gastroesophageal reflux disease)    Mood changes    PONV (postoperative nausea and vomiting)    after Hysterectomy    Wears hearing aid in both ears    Prior to Admission medications   Medication Sig Start Date End Date Taking? Authorizing Provider  citalopram (CELEXA) 20 MG tablet Take 20 mg by mouth daily.   Yes [provider]  hydrochlorothiazide (HYDRODIURIL) 25 MG tablet Take 25 mg by mouth daily.   Yes [provider]  omeprazole (PRILOSEC) 40 MG capsule Take 40 mg by mouth daily.   Yes [provider]  simvastatin (ZOCOR) 20 MG tablet Take 20 mg by mouth at bedtime.   Yes [provider]  valACYclovir (VALTREX) 1000 MG tablet Take 1,000 mg by mouth 2 (two) times daily as needed (for cold sores).   Yes [provider]  enoxaparin (LOVENOX) 40 MG/0.4ML injection Inject 0.4 mLs (40 mg total) into the skin daily. 03/09/18 04/09/21  Lattie Corns, PA-C  naproxen sodium (ALEVE) 220 MG tablet Take 440 mg by mouth 2 (two) times daily as needed (for pain.).  04/09/21  [provider]  oxyCODONE (OXY IR/ROXICODONE) 5 MG immediate release tablet Take 1-2 tablets (5-10 mg total) by mouth every 4 (four) hours as needed for moderate pain. 03/09/18 04/09/21  Lattie Corns, PA-C  pseudoephedrine (SUDAFED) 30 MG tablet Take 30 mg by mouth every 4 (four) hours as needed for congestion.  04/09/21  [provider]  traMADol  (ULTRAM) 50 MG tablet Take 1-2 tablets (50-100 mg total) by mouth every 6 (six) hours as needed for moderate pain. 03/09/18 04/09/21  Lattie Corns, PA-C    Lorna Dibble ,PharmD Clinical Pharmacist  04/09/2021  2:50 PM

## 2021-04-09 NOTE — ED Notes (Signed)
Pt ambulatory to restroom with steady gait and ambulatory back to bed.

## 2021-04-09 NOTE — ED Notes (Signed)
Pt transported to CT scan.

## 2021-04-09 NOTE — Progress Notes (Signed)
PHARMACIST - PHYSICIAN COMMUNICATION  CONCERNING:  Enoxaparin (Lovenox) for DVT Prophylaxis    RECOMMENDATION: Patient was prescribed enoxaprin 40mg  q24 hours for VTE prophylaxis.   Filed Weights   04/09/21 1057  Weight: 104.3 kg (230 lb)    Body mass index is 38.87 kg/m.  Estimated Creatinine Clearance: 77.4 mL/min (by C-G formula based on SCr of 0.93 mg/dL).   Based on [place] patient is candidate for enoxaparin 0.5mg /kg TBW SQ every 24 hours based on BMI being >30.  DESCRIPTION: Pharmacy has adjusted enoxaparin dose per [facility] policy.  Patient is now receiving enoxaparin 50 mg every 24 hours    Dorothe Pea, PharmD,BCPS Clinical Pharmacist  04/09/2021 2:50 PM

## 2021-04-09 NOTE — ED Notes (Signed)
Informed RN bed assigned 

## 2021-04-09 NOTE — ED Notes (Signed)
Pt returned to ED room from MRI

## 2021-04-10 ENCOUNTER — Observation Stay: Payer: PRIVATE HEALTH INSURANCE

## 2021-04-10 ENCOUNTER — Observation Stay
Admit: 2021-04-10 | Discharge: 2021-04-10 | Disposition: A | Discharge status: Home / Self Care | Payer: PRIVATE HEALTH INSURANCE | Attending: Family Medicine | Admitting: Family Medicine

## 2021-04-10 DIAGNOSIS — I10 Essential (primary) hypertension: Secondary | ICD-10-CM | POA: Diagnosis not present

## 2021-04-10 LAB — HIV ANTIBODY (ROUTINE TESTING W REFLEX): HIV Screen 4th Generation wRfx: NONREACTIVE

## 2021-04-10 LAB — ECHOCARDIOGRAM COMPLETE
AR max vel: 3.1 cm2
AV Area VTI: 2.91 cm2
AV Area mean vel: 3.03 cm2
AV Mean grad: 4 mmHg
AV Peak grad: 7.3 mmHg
Ao pk vel: 1.35 m/s
Area-P 1/2: 2.72 cm2
Height: 64.5 in
MV VTI: 3.1 cm2
S' Lateral: 2.5 cm
Weight: 3668.45 oz

## 2021-04-10 LAB — HEMOGLOBIN A1C
Hgb A1c MFr Bld: 6.2 % — ABNORMAL HIGH (ref 4.8–5.6)
Mean Plasma Glucose: 131.24 mg/dL

## 2021-04-10 LAB — LIPID PANEL
Cholesterol: 164 mg/dL (ref 0–200)
HDL: 46 mg/dL (ref >40)
LDL Cholesterol: 99 mg/dL (ref 0–99)
Total CHOL/HDL Ratio: 3.6 RATIO
Triglycerides: 94 mg/dL (ref <150)
VLDL: 19 mg/dL (ref 0–40)

## 2021-04-10 LAB — SARS CORONAVIRUS 2 (TAT 6-24 HRS): SARS Coronavirus 2: NEGATIVE

## 2021-04-10 MED ORDER — IOHEXOL 350 MG/ML SOLN
75.0000 mL | Freq: Once | INTRAVENOUS | Status: AC | PRN
  Administered 2021-04-10: 75 mL via INTRAVENOUS

## 2021-04-10 MED ORDER — ASPIRIN 81 MG PO TBEC: 81.0000 mg | DELAYED_RELEASE_TABLET | Freq: Every day | ORAL | 11 refills | Status: DC

## 2021-04-10 NOTE — Progress Notes (Signed)
OT Screen Note  Patient Details Name: Isabella Luna MRN: [id number] DOB: 1961/09/13   Cancelled Treatment:    Reason Eval/Treat Not Completed: OT screened, no needs identified, will sign off. Thank you for the OT consult. Order received and chart reviewed. Per chart pt reporting symptoms have resolved. She is up ad lib in room, performing ADL tasks without staff assistance. Upon arrival to pt room, pt is standing independently safely untangling blue pulse-ox cord from bed brake. No strength, sensation, balance or safety deficits appreciated. Pt reports feeling at baseline level for ADL management, and is eager for DC home. No skilled OT needs identified. Will sign off at this time. Please re-consult if additional OT needs arise during this hospital stay.   Shara Blazing, M.S., OTR/L Ascom: [phone] 04/10/21, 10:03 AM

## 2021-04-10 NOTE — Progress Notes (Signed)
*  PRELIMINARY RESULTS* Echocardiogram 2D Echocardiogram has been performed.  Isabella Luna 04/10/2021, 9:40 AM

## 2021-04-10 NOTE — Progress Notes (Signed)
Patient verbalized understanding of all discharge instructions including follow up appointments.

## 2021-04-10 NOTE — Plan of Care (Signed)
Pt rested during overnight. Ad lib in room. NIH 0. No signs of neuro changes. Vitals stable. Q2 vs/nih complete. Covid swab sent.  Problem: Education: Goal: Knowledge of General Education information will improve Description: Including pain rating scale, medication(s)/side effects and non-pharmacologic comfort measures Outcome: Progressing   Problem: Health Behavior/Discharge Planning: Goal: Ability to manage health-related needs will improve Outcome: Progressing   Problem: Clinical Measurements: Goal: Ability to maintain clinical measurements within normal limits will improve Outcome: Progressing Goal: Will remain free from infection Outcome: Progressing Goal: Diagnostic test results will improve Outcome: Progressing Goal: Respiratory complications will improve Outcome: Progressing Goal: Cardiovascular complication will be avoided Outcome: Progressing   Problem: Activity: Goal: Risk for activity intolerance will decrease Outcome: Progressing   Problem: Nutrition: Goal: Adequate nutrition will be maintained Outcome: Progressing   Problem: Coping: Goal: Level of anxiety will decrease Outcome: Progressing   Problem: Elimination: Goal: Will not experience complications related to bowel motility Outcome: Progressing Goal: Will not experience complications related to urinary retention Outcome: Progressing   Problem: Pain Managment: Goal: General experience of comfort will improve Outcome: Progressing   Problem: Safety: Goal: Ability to remain free from injury will improve Outcome: Progressing   Problem: Skin Integrity: Goal: Risk for impaired skin integrity will decrease Outcome: Progressing

## 2021-04-10 NOTE — Discharge Summary (Signed)
Physician Discharge Summary  SHERRILYN NAIRN TZG:[id number] DOB: 28-Jul-1961 DOA: 04/09/2021  PCP: Rusty Aus, MD  Admit date: 04/09/2021 Discharge date: 04/10/2021  Admitted From: home Disposition:  home  Recommendations for Outpatient Follow-up:  Follow up with PCP / neurology in 1-2 weeks. Advised outpatient thyroid ultrasound from PCP within few weeks for multinodular thyroid, with 1.9 cm on the right  Home Health:no  Equipment/Devices: none  Discharge Condition: Stable Code Status:   Code Status: Full Code Diet recommendation:  Diet Order             Diet - low sodium heart healthy           Diet Heart Room service appropriate? Yes; Fluid consistency: Thin  Diet effective now                    Brief/Interim Summary: 60 year old female with GERD osteoarthritis chronic right shoulder bursitis hypertension depression dyslipidemia presented to the ED with right arm numbness and tingling with blurry vision acute onset 7/7.  She was seen in the ED blood pressure stable,, CT head no acute finding, neuro was consulted and admitted for stroke work-up. Underwent MRI of the brain no acute stroke was found, MR head and neck showed possible right vertebral lower slow flow versus occlusion, underwent CT angio head and neck and vertebral artery patent.  Complete stroke work-up with echocardiogram, LDL HbA1c. Cta neck with patent vert art.pt doing well and wants to go home. Discussed w/ Dr Quinn Axe and okay to go home.  Discharge Diagnoses:   TIA with right arm numbness tingling and blurry vision.  Symptoms have resolved, MRI no acute finding.  CT angio head and neck no LVO patent vertebral artery.  LDL 99, A1c pending . Also had echo done.  Patient normally takes Zocor and HCTZ at home.  Placed on aspirin 81 mg and okay for home today per nephro.  Hypertension blood pressure stable, resume HCTZ upon discharge. Dyslipidemia LDL 99 on Zocor Depression continue her Celexa. GERD continue her  PPI Multinodular thyroid with largest 1.9 cm in the right , will need outpatient ultrasound from PCP  Consults: Neurology  Subjective: Alert awake, normal Resting denies any visual problem, paresthesias, weakness.  Discharge Exam: Vitals:   04/10/21 0809 04/10/21 1136  BP: 140/72 130/65  Pulse: 73 61  Resp: 18 17  Temp: 98 F (36.7 C) 98.1 F (36.7 C)  SpO2: 99% 98%   General: Pt is alert, awake, not in acute distress Cardiovascular: RRR, S1/S2 +, no rubs, no gallops Respiratory: CTA bilaterally, no wheezing, no rhonchi Abdominal: Soft, NT, ND, bowel sounds + Extremities: no edema, no cyanosis  Discharge Instructions  Discharge Instructions     Diet - low sodium heart healthy   Complete by: As directed    Discharge instructions   Complete by: As directed    Please call call MD or return to ER for similar or worsening recurring problem that brought you to hospital or if any fever,nausea/vomiting,abdominal pain, uncontrolled pain, chest pain,  shortness of breath or any other alarming symptoms.  Please follow-up your doctor as instructed in a week time and call the office for appointment.  Please avoid alcohol, smoking, or any other illicit substance and maintain healthy habits including taking your regular medications as prescribed.  You were cared for by a hospitalist during your hospital stay. If you have any questions about your discharge medications or the care you received while you were in the hospital  after you are discharged, you can call the unit and ask to speak with the hospitalist on call if the hospitalist that took care of you is not available.  Once you are discharged, your primary care physician will handle any further medical issues. Please note that NO REFILLS for any discharge medications will be authorized once you are discharged, as it is imperative that you return to your primary care physician (or establish a relationship with a primary care physician  if you do not have one) for your aftercare needs so that they can reassess your need for medications and monitor your lab values   Increase activity slowly   Complete by: As directed       Allergies as of 04/10/2021       Reactions   Cortisone Other (See Comments)   Facial redness, insomnia        Medication List     TAKE these medications    aspirin 81 MG EC tablet Take 1 tablet (81 mg total) by mouth daily. Swallow whole. Start taking on: April 11, 2021   cholecalciferol 25 MCG (1000 UNIT) tablet Commonly known as: VITAMIN D3 Take 1,000 Units by mouth daily.   citalopram 20 MG tablet Commonly known as: CELEXA Take 20 mg by mouth daily.   Cyanocobalamin 1000 MCG/ML Liqd Place 1,000 mcg under the tongue every other day.   hydrochlorothiazide 25 MG tablet Commonly known as: HYDRODIURIL Take 25 mg by mouth daily.   omeprazole 40 MG capsule Commonly known as: PRILOSEC Take 40 mg by mouth daily.   simvastatin 20 MG tablet Commonly known as: ZOCOR Take 20 mg by mouth at bedtime.   valACYclovir 1000 MG tablet Commonly known as: VALTREX Take 1,000 mg by mouth 2 (two) times daily as needed (for cold sores).        Allergies  Allergen Reactions   Cortisone Other (See Comments)    Facial redness, insomnia    The results of significant diagnostics from this hospitalization (including imaging, microbiology, ancillary and laboratory) are listed below for reference.    Microbiology: Recent Results (from the past 240 hour(s))  SARS CORONAVIRUS 2 (TAT 6-24 HRS) Nasopharyngeal Nasopharyngeal Swab     Status: None   Collection Time: 04/10/21  1:20 AM   Specimen: Nasopharyngeal Swab  Result Value Ref Range Status   SARS Coronavirus 2 NEGATIVE NEGATIVE Final    Comment: (NOTE) SARS-CoV-2 target nucleic acids are NOT DETECTED.  The SARS-CoV-2 RNA is generally detectable in upper and lower respiratory specimens during the acute phase of infection. Negative results do  not preclude SARS-CoV-2 infection, do not rule out co-infections with other pathogens, and should not be used as the sole basis for treatment or other patient management decisions. Negative results must be combined with clinical observations, patient history, and epidemiological information. The expected result is Negative.  Fact Sheet for Patients: SugarRoll.be  Fact Sheet for Healthcare Providers: https://www.woods-mathews.com/  This test is not yet approved or cleared by the [place] FDA and  has been authorized for detection and/or diagnosis of SARS-CoV-2 by FDA under an Emergency Use Authorization (EUA). This EUA will remain  in effect (meaning this test can be used) for the duration of the COVID-19 declaration under Se ction 564(b)(1) of the Act, 21 U.S.C. section 360bbb-3(b)(1), unless the authorization is terminated or revoked sooner.  Performed at [facility], [place] [address] [place], [place] [address]     Procedures/Studies: CT ANGIO HEAD NECK W WO CM  Result Date: 04/10/2021 CLINICAL DATA:  Right arm tingling, blurry vision, possible abnormal right vertebral artery on MRA EXAM: CT ANGIOGRAPHY HEAD AND NECK TECHNIQUE: Multidetector CT imaging of the head and neck was performed using the standard protocol during bolus administration of intravenous contrast. Multiplanar CT image reconstructions and MIPs were obtained to evaluate the vascular anatomy. Carotid stenosis measurements (when applicable) are obtained utilizing NASCET criteria, using the distal internal carotid diameter as the denominator. CONTRAST:  68mL OMNIPAQUE IOHEXOL 350 MG/ML SOLN COMPARISON:  CT head and MRI/MRA 04/09/2021 FINDINGS: CT HEAD Brain: There is no acute intracranial hemorrhage, mass effect, or edema. Gray-white differentiation is preserved. There is no extra-axial fluid collection. Ventricles and sulci are within normal limits in size and  configuration. Vascular: No new finding. Skull: Calvarium is unremarkable. Sinuses/Orbits: No acute finding. Other: Bilateral mastoid effusions. Review of the MIP images confirms the above findings CTA NECK Aortic arch: Great vessel origins are patent. Right carotid system: Patent.  No stenosis. Left carotid system: Patent. Trace calcified plaque along the proximal internal carotid. No stenosis. Vertebral arteries: Dominant left vertebral artery is patent. Right vertebral artery is also patent. The proximal V1 segment is not as well evaluated due to streak artifact Skeleton: Degenerative changes of the cervical spine primarily at C4-C5, C5-C6, and C6-C7. Other neck: Multinodular thyroid. Nodule on the right measures 1.9 cm. Upper chest: Included upper lungs are clear. Review of the MIP images confirms the above findings CTA HEAD Anterior circulation: Intracranial internal carotid arteries are patent with mild atherosclerotic irregularity along the cavernous segments. Anterior and middle cerebral arteries are patent. Posterior circulation: Intracranial vertebral arteries are patent. Basilar artery is patent. Major cerebellar artery origins are patent. Small bilateral posterior communicating arteries are present. Posterior cerebral arteries are patent. Venous sinuses: Patent as allowed by contrast bolus timing. Review of the MIP images confirms the above findings IMPRESSION: Right vertebral artery is patent. No large vessel occlusion or hemodynamically significant stenosis. Multinodular thyroid. Largest on the right measures 1.9 cm. Outpatient ultrasound evaluation is recommended if not previously performed. Electronically Signed   By: Macy Mis M.D.   On: 04/10/2021 10:03   MR ANGIO HEAD WO CONTRAST  Result Date: 04/09/2021 CLINICAL DATA:  Blurred vision. Right arm tingling. TIA versus stroke. EXAM: MRI HEAD WITHOUT CONTRAST MRA HEAD WITHOUT CONTRAST MRA OF THE NECK WITHOUT AND WITH CONTRAST TECHNIQUE:  Multiplanar, multi-echo pulse sequences of the brain and surrounding structures were acquired without intravenous contrast. Angiographic images of the Circle of Willis were acquired using MRA technique without intravenous contrast. Angiographic images of the neck were acquired using MRA technique without and with intravenous contrast. Carotid stenosis measurements (when applicable) are obtained utilizing NASCET criteria, using the distal internal carotid diameter as the denominator. CONTRAST:  39mL GADAVIST GADOBUTROL 1 MMOL/ML IV SOLN COMPARISON:  Head CT same day FINDINGS: MR HEAD FINDINGS Brain: Diffusion imaging does not show any acute or subacute infarction. No abnormality affects the brainstem or cerebellum. Cerebral hemispheres show a very few punctate foci of T2 and FLAIR signal within the white matter, not likely significant. No cortical or large vessel territory infarction. No mass lesion, hemorrhage, hydrocephalus or extra-axial collection. Vascular: Major vessels at the base of the brain show flow. Skull and upper cervical spine: Negative Sinuses/Orbits: Sinuses are clear.  Orbits are negative. Other: Extensive bilateral mastoid effusions. MRA HEAD FINDINGS Both internal carotid arteries are widely patent into the brain. No siphon stenosis. The anterior and middle cerebral vessels are patent without  proximal stenosis, aneurysm or vascular malformation. Both vertebral arteries are widely patent to the basilar. No basilar stenosis. Posterior circulation branch vessels appear normal. MRA NECK FINDINGS Aortic arch: Normal Right carotid system: Normal Left carotid system: Normal Vertebral arteries: Left vertebral artery is widely patent at its origin and through the cervical region to the basilar artery. Preliminary noncontrast scanning beginning in the lower cervical spine appears to show antegrade flow in the right vertebral artery, but the contrast enhanced exam does not show any flow in the right  vertebral artery until apparent reconstitution in the upper cervical region, beyond which vessel is patent through the foramen magnum. This discrepancy is difficult to reconcile, but right vertebral artery occlusion or dissection is not excluded and CT angiography is recommended to clarify this. Other: None. IMPRESSION: No acute brain finding. Few punctate foci of T2 and FLAIR signal within the cerebral hemispheric white matter, likely representing the earliest manifestation of small vessel change. Normal intracranial MR angiography. MR angiography of the neck vessels suggests an abnormal right vertebral artery, which could be occluded or show very slow flow. There is a discrepancy on the data from the noncontrast study in the contrast study that I cannot explain. I would suggest CT angiography to ensure that we are not dealing with right vertebral artery pathology. Electronically Signed   By: Nelson Chimes M.D.   On: 04/09/2021 17:25   MR ANGIO NECK W WO CONTRAST  Result Date: 04/09/2021 CLINICAL DATA:  Blurred vision. Right arm tingling. TIA versus stroke. EXAM: MRI HEAD WITHOUT CONTRAST MRA HEAD WITHOUT CONTRAST MRA OF THE NECK WITHOUT AND WITH CONTRAST TECHNIQUE: Multiplanar, multi-echo pulse sequences of the brain and surrounding structures were acquired without intravenous contrast. Angiographic images of the Circle of Willis were acquired using MRA technique without intravenous contrast. Angiographic images of the neck were acquired using MRA technique without and with intravenous contrast. Carotid stenosis measurements (when applicable) are obtained utilizing NASCET criteria, using the distal internal carotid diameter as the denominator. CONTRAST:  67mL GADAVIST GADOBUTROL 1 MMOL/ML IV SOLN COMPARISON:  Head CT same day FINDINGS: MR HEAD FINDINGS Brain: Diffusion imaging does not show any acute or subacute infarction. No abnormality affects the brainstem or cerebellum. Cerebral hemispheres show a very few  punctate foci of T2 and FLAIR signal within the white matter, not likely significant. No cortical or large vessel territory infarction. No mass lesion, hemorrhage, hydrocephalus or extra-axial collection. Vascular: Major vessels at the base of the brain show flow. Skull and upper cervical spine: Negative Sinuses/Orbits: Sinuses are clear.  Orbits are negative. Other: Extensive bilateral mastoid effusions. MRA HEAD FINDINGS Both internal carotid arteries are widely patent into the brain. No siphon stenosis. The anterior and middle cerebral vessels are patent without proximal stenosis, aneurysm or vascular malformation. Both vertebral arteries are widely patent to the basilar. No basilar stenosis. Posterior circulation branch vessels appear normal. MRA NECK FINDINGS Aortic arch: Normal Right carotid system: Normal Left carotid system: Normal Vertebral arteries: Left vertebral artery is widely patent at its origin and through the cervical region to the basilar artery. Preliminary noncontrast scanning beginning in the lower cervical spine appears to show antegrade flow in the right vertebral artery, but the contrast enhanced exam does not show any flow in the right vertebral artery until apparent reconstitution in the upper cervical region, beyond which vessel is patent through the foramen magnum. This discrepancy is difficult to reconcile, but right vertebral artery occlusion or dissection is not excluded and CT angiography  is recommended to clarify this. Other: None. IMPRESSION: No acute brain finding. Few punctate foci of T2 and FLAIR signal within the cerebral hemispheric white matter, likely representing the earliest manifestation of small vessel change. Normal intracranial MR angiography. MR angiography of the neck vessels suggests an abnormal right vertebral artery, which could be occluded or show very slow flow. There is a discrepancy on the data from the noncontrast study in the contrast study that I cannot  explain. I would suggest CT angiography to ensure that we are not dealing with right vertebral artery pathology. Electronically Signed   By: Nelson Chimes M.D.   On: 04/09/2021 17:25   MR BRAIN WO CONTRAST  Result Date: 04/09/2021 CLINICAL DATA:  Blurred vision. Right arm tingling. TIA versus stroke. EXAM: MRI HEAD WITHOUT CONTRAST MRA HEAD WITHOUT CONTRAST MRA OF THE NECK WITHOUT AND WITH CONTRAST TECHNIQUE: Multiplanar, multi-echo pulse sequences of the brain and surrounding structures were acquired without intravenous contrast. Angiographic images of the Circle of Willis were acquired using MRA technique without intravenous contrast. Angiographic images of the neck were acquired using MRA technique without and with intravenous contrast. Carotid stenosis measurements (when applicable) are obtained utilizing NASCET criteria, using the distal internal carotid diameter as the denominator. CONTRAST:  30mL GADAVIST GADOBUTROL 1 MMOL/ML IV SOLN COMPARISON:  Head CT same day FINDINGS: MR HEAD FINDINGS Brain: Diffusion imaging does not show any acute or subacute infarction. No abnormality affects the brainstem or cerebellum. Cerebral hemispheres show a very few punctate foci of T2 and FLAIR signal within the white matter, not likely significant. No cortical or large vessel territory infarction. No mass lesion, hemorrhage, hydrocephalus or extra-axial collection. Vascular: Major vessels at the base of the brain show flow. Skull and upper cervical spine: Negative Sinuses/Orbits: Sinuses are clear.  Orbits are negative. Other: Extensive bilateral mastoid effusions. MRA HEAD FINDINGS Both internal carotid arteries are widely patent into the brain. No siphon stenosis. The anterior and middle cerebral vessels are patent without proximal stenosis, aneurysm or vascular malformation. Both vertebral arteries are widely patent to the basilar. No basilar stenosis. Posterior circulation branch vessels appear normal. MRA NECK  FINDINGS Aortic arch: Normal Right carotid system: Normal Left carotid system: Normal Vertebral arteries: Left vertebral artery is widely patent at its origin and through the cervical region to the basilar artery. Preliminary noncontrast scanning beginning in the lower cervical spine appears to show antegrade flow in the right vertebral artery, but the contrast enhanced exam does not show any flow in the right vertebral artery until apparent reconstitution in the upper cervical region, beyond which vessel is patent through the foramen magnum. This discrepancy is difficult to reconcile, but right vertebral artery occlusion or dissection is not excluded and CT angiography is recommended to clarify this. Other: None. IMPRESSION: No acute brain finding. Few punctate foci of T2 and FLAIR signal within the cerebral hemispheric white matter, likely representing the earliest manifestation of small vessel change. Normal intracranial MR angiography. MR angiography of the neck vessels suggests an abnormal right vertebral artery, which could be occluded or show very slow flow. There is a discrepancy on the data from the noncontrast study in the contrast study that I cannot explain. I would suggest CT angiography to ensure that we are not dealing with right vertebral artery pathology. Electronically Signed   By: Nelson Chimes M.D.   On: 04/09/2021 17:25   ECHOCARDIOGRAM COMPLETE  Result Date: 04/10/2021    ECHOCARDIOGRAM REPORT   Patient Name:   VENOLA  H Hout Date of Exam: 04/10/2021 Medical Rec #:  [id number]      Height:       64.5 in Accession #:    [id number]     Weight:       229.3 lb Date of Birth:  [birth year]      BSA:          2.084 m Patient Age:    60 years       BP:           142/59 mmHg Patient Gender: F              HR:           70 bpm. Exam Location:  [facility] Procedure: 2D Echo, Color Doppler and Cardiac Doppler Indications:     G45.9 TIA  History:         Patient has no prior history of Echocardiogram examinations.                   Risk Factors:Hypertension and Dyslipidemia.  Sonographer:     Charmayne Sheer RDCS (AE) Referring Phys:  [id number] Arvella Merles [place] Diagnosing Phys: Serafina Royals MD  Sonographer Comments: Suboptimal apical window and suboptimal subcostal window. Image acquisition challenging due to patient body habitus. IMPRESSIONS  1. Left ventricular ejection fraction, by estimation, is 60 to 65%. The left ventricle has normal function. The left ventricle has no regional wall motion abnormalities. Left ventricular diastolic parameters were normal.  2. Right ventricular systolic function is normal. The right ventricular size is normal.  3. The mitral valve is normal in structure. Trivial mitral valve regurgitation.  4. The aortic valve is normal in structure. Aortic valve regurgitation is trivial. FINDINGS  Left Ventricle: Left ventricular ejection fraction, by estimation, is 60 to 65%. The left ventricle has normal function. The left ventricle has no regional wall motion abnormalities. The left ventricular internal cavity size was normal in size. There is  no left ventricular hypertrophy. Left ventricular diastolic parameters were normal. Right Ventricle: The right ventricular size is normal. No increase in right ventricular wall thickness. Right ventricular systolic function is normal. Left Atrium: Left atrial size was normal in size. Right Atrium: Right atrial size was normal in size. Pericardium: There is no evidence of pericardial effusion. Mitral Valve: The mitral valve is normal in structure. Trivial mitral valve regurgitation. MV peak gradient, 3.3 mmHg. The mean mitral valve gradient is 1.0 mmHg. Tricuspid Valve: The tricuspid valve is normal in structure. Tricuspid valve regurgitation is trivial. Aortic Valve: The aortic valve is normal in structure. Aortic valve regurgitation is trivial. Aortic valve mean gradient measures 4.0 mmHg. Aortic valve peak gradient measures 7.3 mmHg. Aortic valve area, by VTI measures  2.91 cm. Pulmonic Valve: The pulmonic valve was normal in structure. Pulmonic valve regurgitation is not visualized. Aorta: The aortic root and ascending aorta are structurally normal, with no evidence of dilitation. IAS/Shunts: No atrial level shunt detected by color flow Doppler.  LEFT VENTRICLE PLAX 2D LVIDd:         4.10 cm  Diastology LVIDs:         2.50 cm  LV e' medial:    6.85 cm/s LV PW:         1.00 cm  LV E/e' medial:  12.7 LV IVS:        1.00 cm  LV e' lateral:   10.00 cm/s LVOT diam:     2.00 cm  LV E/e' lateral: 8.7 LV  SV:         83 LV SV Index:   40 LVOT Area:     3.14 cm  RIGHT VENTRICLE RV Basal diam:  3.10 cm LEFT ATRIUM             Index       RIGHT ATRIUM          Index LA diam:        3.60 cm 1.73 cm/m  RA Area:     7.06 cm LA Vol (A2C):   30.5 ml 14.63 ml/m RA Volume:   12.00 ml 5.76 ml/m LA Vol (A4C):   40.7 ml 19.53 ml/m LA Biplane Vol: 35.9 ml 17.22 ml/m  AORTIC VALVE                   PULMONIC VALVE AV Area (Vmax):    3.10 cm    PV Vmax:       0.78 m/s AV Area (Vmean):   3.03 cm    PV Vmean:      54.400 cm/s AV Area (VTI):     2.91 cm    PV VTI:        0.146 m AV Vmax:           135.00 cm/s PV Peak grad:  2.4 mmHg AV Vmean:          93.300 cm/s PV Mean grad:  1.0 mmHg AV VTI:            0.284 m AV Peak Grad:      7.3 mmHg AV Mean Grad:      4.0 mmHg LVOT Vmax:         133.00 cm/s LVOT Vmean:        90.000 cm/s LVOT VTI:          0.263 m LVOT/AV VTI ratio: 0.93  AORTA Ao Root diam: 3.00 cm MITRAL VALVE MV Area (PHT): 2.72 cm    SHUNTS MV Area VTI:   3.10 cm    Systemic VTI:  0.26 m MV Peak grad:  3.3 mmHg    Systemic Diam: 2.00 cm MV Mean grad:  1.0 mmHg MV Vmax:       0.90 m/s MV Vmean:      50.2 cm/s MV Decel Time: 279 msec MV E velocity: 87.00 cm/s MV A velocity: 99.00 cm/s MV E/A ratio:  0.88 Serafina Royals MD Electronically signed by Serafina Royals MD Signature Date/Time: 04/10/2021/1:05:37 PM    Final    CT HEAD CODE STROKE WO CONTRAST  Result Date: 04/09/2021 CLINICAL  DATA:  Code stroke. Right arm numbness and weakness with blurred vision EXAM: CT HEAD WITHOUT CONTRAST TECHNIQUE: Contiguous axial images were obtained from the base of the skull through the vertex without intravenous contrast. COMPARISON:  None. FINDINGS: Brain: No evidence of acute infarction, hemorrhage, hydrocephalus, extra-axial collection or mass lesion/mass effect. Vascular: No hyperdense vessel or unexpected calcification. Skull: Normal. Negative for fracture or focal lesion. Sinuses/Orbits: No acute finding. Other: These results were called by telephone at the time of interpretation on 04/09/2021 at 11:13 am to provider [facility] , who verbally acknowledged these results. ASPECTS [facility] Stroke Program Early CT Score) - Ganglionic level infarction (caudate, lentiform nuclei, internal capsule, insula, M1-M3 cortex): 7 - Supraganglionic infarction (M4-M6 cortex): 3 Total score (0-10 with 10 being normal): 10 IMPRESSION: Negative head CT. Electronically Signed   By: Monte Fantasia M.D.   On: 04/09/2021 11:14    Labs: BNP (  last 3 results) Recent Labs    01/09/21 1524  BNP 97.3   Basic Metabolic Panel: Recent Labs  Lab 04/09/21 1100  NA 137  K 3.4*  CL 100  CO2 29  GLUCOSE 97  BUN 15  CREATININE 0.93  CALCIUM 9.3   Liver Function Tests: Recent Labs  Lab 04/09/21 1100  AST 20  ALT 16  ALKPHOS 61  BILITOT 1.1  PROT 7.8  ALBUMIN 4.5   No results for input(s): LIPASE, AMYLASE in the last 168 hours. No results for input(s): AMMONIA in the last 168 hours. CBC: Recent Labs  Lab 04/09/21 1100  WBC 7.8  NEUTROABS 5.6  HGB 16.2*  HCT 46.2*  MCV 83.7  PLT 261   Cardiac Enzymes: No results for input(s): CKTOTAL, CKMB, CKMBINDEX, TROPONINI in the last 168 hours. BNP: Invalid input(s): POCBNP CBG: Recent Labs  Lab 04/09/21 1100  GLUCAP 94   D-Dimer No results for input(s): DDIMER in the last 72 hours. Hgb A1c No results for input(s): HGBA1C in the last 72  hours. Lipid Profile Recent Labs    04/10/21 0449  CHOL 164  HDL 46  LDLCALC 99  TRIG 94  CHOLHDL 3.6   Thyroid function studies No results for input(s): TSH, T4TOTAL, T3FREE, THYROIDAB in the last 72 hours.  Invalid input(s): FREET3 Anemia work up No results for input(s): VITAMINB12, FOLATE, FERRITIN, TIBC, IRON, RETICCTPCT in the last 72 hours. Urinalysis    Component Value Date/Time   COLORURINE STRAW (A) 02/22/2018 1138   APPEARANCEUR CLEAR (A) 02/22/2018 1138   LABSPEC 1.010 02/22/2018 1138   PHURINE 5.0 02/22/2018 1138   GLUCOSEU NEGATIVE 02/22/2018 1138   HGBUR SMALL (A) 02/22/2018 1138   BILIRUBINUR NEGATIVE 02/22/2018 1138   KETONESUR NEGATIVE 02/22/2018 1138   PROTEINUR NEGATIVE 02/22/2018 1138   NITRITE NEGATIVE 02/22/2018 1138   LEUKOCYTESUR NEGATIVE 02/22/2018 1138   Sepsis Labs Invalid input(s): PROCALCITONIN,  WBC,  LACTICIDVEN Microbiology Recent Results (from the past 240 hour(s))  SARS CORONAVIRUS 2 (TAT 6-24 HRS) Nasopharyngeal Nasopharyngeal Swab     Status: None   Collection Time: 04/10/21  1:20 AM   Specimen: Nasopharyngeal Swab  Result Value Ref Range Status   SARS Coronavirus 2 NEGATIVE NEGATIVE Final    Comment: (NOTE) SARS-CoV-2 target nucleic acids are NOT DETECTED.  The SARS-CoV-2 RNA is generally detectable in upper and lower respiratory specimens during the acute phase of infection. Negative results do not preclude SARS-CoV-2 infection, do not rule out co-infections with other pathogens, and should not be used as the sole basis for treatment or other patient management decisions. Negative results must be combined with clinical observations, patient history, and epidemiological information. The expected result is Negative.  Fact Sheet for Patients: SugarRoll.be  Fact Sheet for Healthcare Providers: https://www.woods-mathews.com/  This test is not yet approved or cleared by the [place]  FDA and  has been authorized for detection and/or diagnosis of SARS-CoV-2 by FDA under an Emergency Use Authorization (EUA). This EUA will remain  in effect (meaning this test can be used) for the duration of the COVID-19 declaration under Se ction 564(b)(1) of the Act, 21 U.S.C. section 360bbb-3(b)(1), unless the authorization is terminated or revoked sooner.  Performed at [facility], [place] [address] [place], [place] [address]      Time coordinating discharge: 25 minutes  SIGNED: Antonieta Pert, MD  [facility] 04/10/2021, 1:27 PM  If 7PM-7AM, please contact night-coverage [email or web address]

## 2021-04-10 NOTE — Progress Notes (Signed)
Patient OOF in stable condition via w/c

## 2021-04-10 NOTE — Progress Notes (Addendum)
SLP Cancellation Note  Patient Details Name: Isabella Luna MRN: [id number] DOB: [birth year]   Cancelled treatment:       Reason Eval/Treat Not Completed: SLP screened, no needs identified, will sign off (chart reviewed; consulted NSG then met w/ pt). Pt denied any difficulty swallowing and is currently on a regular diet; tolerates swallowing pills w/ water per NSG. Pt conversed in conversation w/out expressive/receptive deficits noted; pt denied any speech-language deficits. Speech clear. No further skilled ST services indicated as pt appears at her baseline. Pt agreed. NSG to reconsult if any change in status while admitted.       Orinda Kenner, MS, CCC-SLP Speech Language Pathologist [facility] [phone] [facility] 04/10/2021, 8:54 AM

## 2022-05-03 ENCOUNTER — Other Ambulatory Visit (HOSPITAL_COMMUNITY): Payer: Self-pay

## 2022-05-03 MED ORDER — SIMVASTATIN 20 MG PO TABS
20.0000 mg | ORAL_TABLET | Freq: Every day | ORAL | 3 refills | Status: AC
  Filled 2022-05-03: qty 32, 32d supply, fill #0
  Filled 2022-05-03: qty 58, 58d supply, fill #0
  Filled 2022-05-04: qty 90, 90d supply, fill #0
  Filled 2022-08-01: qty 90, 90d supply, fill #1
  Filled 2022-10-26: qty 90, 90d supply, fill #2

## 2022-05-03 MED ORDER — CITALOPRAM HYDROBROMIDE 20 MG PO TABS
20.0000 mg | ORAL_TABLET | Freq: Every day | ORAL | 3 refills | Status: AC
  Filled 2022-05-03: qty 90, 90d supply, fill #0
  Filled 2022-08-01: qty 90, 90d supply, fill #1
  Filled 2022-10-26: qty 90, 90d supply, fill #2

## 2022-05-03 MED ORDER — HYDROCHLOROTHIAZIDE 25 MG PO TABS
25.0000 mg | ORAL_TABLET | Freq: Every day | ORAL | 3 refills | Status: DC
  Filled 2022-05-03: qty 90, 90d supply, fill #0
  Filled 2022-10-26: qty 90, 90d supply, fill #1

## 2022-05-04 ENCOUNTER — Other Ambulatory Visit (HOSPITAL_COMMUNITY): Payer: Self-pay

## 2022-07-12 ENCOUNTER — Other Ambulatory Visit (HOSPITAL_COMMUNITY): Payer: Self-pay

## 2022-07-12 MED ORDER — NA SULFATE-K SULFATE-MG SULF 17.5-3.13-1.6 GM/177ML PO SOLN
ORAL | 0 refills | Status: DC
  Filled 2022-07-12: qty 354, 1d supply, fill #0

## 2022-07-13 ENCOUNTER — Other Ambulatory Visit (HOSPITAL_COMMUNITY): Payer: Self-pay

## 2022-08-02 ENCOUNTER — Other Ambulatory Visit (HOSPITAL_COMMUNITY): Payer: Self-pay

## 2022-09-09 ENCOUNTER — Other Ambulatory Visit (HOSPITAL_COMMUNITY): Payer: Self-pay

## 2022-09-09 MED ORDER — CEFUROXIME AXETIL 500 MG PO TABS
250.0000 mg | ORAL_TABLET | Freq: Two times a day (BID) | ORAL | 0 refills | Status: DC
  Filled 2022-09-09: qty 7, 7d supply, fill #0

## 2022-10-24 NOTE — H&P (Signed)
Pre-Procedure H&P   Patient ID: Isabella Luna is a 62 y.o. female.  Gastroenterology Provider: Annamaria Helling, DO  Referring Provider: Dr. Sabra Heck PCP: Rusty Aus, MD  Date: 10/25/2022  HPI Isabella Luna is a 61 y.o. female who presents today for Colonoscopy for Screening-family history of colon polyps.  Family history of colon cancer specifically in her mother in her 29s.  Father with colon polyps. The patient's last colonoscopy was in April 2018 demonstrating hyperplastic polyps and internal hemorrhoids.  Additional colonoscopy in January 2013 with hyperplastic polyps. Patient reports daily bowel movement without melena or hematochezia diarrhea or constipation. Most recent lab work hemoglobin 15.3 MCV 85.8 platelets 227,000 creatinine 0.7 B12 298   Past Medical History:  Diagnosis Date   Arthritis    Cancer ([place])    skin   GERD (gastroesophageal reflux disease)    Mood changes    PONV (postoperative nausea and vomiting)    after Hysterectomy    Wears hearing aid in both ears     Past Surgical History:  Procedure Laterality Date   ABDOMINAL HYSTERECTOMY     BLADDER SUSPENSION     BUNIONECTOMY Bilateral    COLONOSCOPY WITH ESOPHAGOGASTRODUODENOSCOPY (EGD)     KNEE ARTHROSCOPY WITH MEDIAL MENISECTOMY Left 05/04/2017   Procedure: Arthroscopic medial meniscus repair and arthroscopic partial lateral meniscectomy, right knee. ;  Surgeon: Corky Mull, MD;  Location: [facility];  Service: Orthopedics;  Laterality: Left;   TOTAL KNEE ARTHROPLASTY Left 03/07/2018   Procedure: TOTAL KNEE ARTHROPLASTY;  Surgeon: Corky Mull, MD;  Location: [facility];  Service: Orthopedics;  Laterality: Left;    Family History Mother- crc 8s; father colon polyps No other h/o GI disease or malignancy  Review of Systems  Constitutional:  Negative for activity change, appetite change, chills, diaphoresis, fatigue, fever and unexpected weight change.  HENT:  Negative for  trouble swallowing and voice change.   Respiratory:  Negative for shortness of breath and wheezing.   Cardiovascular:  Negative for chest pain, palpitations and leg swelling.  Gastrointestinal:  Negative for abdominal distention, abdominal pain, anal bleeding, blood in stool, constipation, diarrhea, nausea, rectal pain and vomiting.  Musculoskeletal:  Negative for arthralgias and myalgias.  Skin:  Negative for color change and pallor.  Neurological:  Negative for dizziness, syncope and weakness.  Psychiatric/Behavioral:  Negative for confusion.   All other systems reviewed and are negative.    Medications No current facility-administered medications on file prior to encounter.   Current Outpatient Medications on File Prior to Encounter  Medication Sig Dispense Refill   citalopram (CELEXA) 20 MG tablet Take 1 tablet (20 mg total) by mouth daily. 90 tablet 3   hydrochlorothiazide (HYDRODIURIL) 25 MG tablet Take 25 mg by mouth daily.     simvastatin (ZOCOR) 20 MG tablet Take 20 mg by mouth at bedtime.     simvastatin (ZOCOR) 20 MG tablet Take 1 tablet (20 mg total) by mouth daily. 90 tablet 3   aspirin EC 81 MG EC tablet Take 1 tablet (81 mg total) by mouth daily. Swallow whole. (Patient not taking: Reported on 10/25/2022) 30 tablet 11   cholecalciferol (VITAMIN D3) 25 MCG (1000 UNIT) tablet Take 1,000 Units by mouth daily.     citalopram (CELEXA) 20 MG tablet Take 20 mg by mouth daily.     Cyanocobalamin 1000 MCG/ML LIQD Place 1,000 mcg under the tongue every other day.     hydrochlorothiazide (HYDRODIURIL) 25 MG tablet  Take 1 tablet (25 mg total) by mouth daily. 90 tablet 3   Na Sulfate-K Sulfate-Mg Sulf 17.5-3.13-1.6 GM/177ML SOLN Take both bottles at the times instructed by your provider. 354 mL 0   omeprazole (PRILOSEC) 40 MG capsule Take 40 mg by mouth daily.     valACYclovir (VALTREX) 1000 MG tablet Take 1,000 mg by mouth 2 (two) times daily as needed (for cold sores).      Pertinent  medications related to GI and procedure were reviewed by me with the patient prior to the procedure   Current Facility-Administered Medications:    0.9 %  sodium chloride infusion, , Intravenous, Continuous, Annamaria Helling, DO      Allergies  Allergen Reactions   Cortisone Other (See Comments)    Facial redness, insomnia   Allergies were reviewed by me prior to the procedure  Objective   Body mass index is 34.86 kg/m. Vitals:   10/25/22 0707  BP: 133/68  Pulse: 73  Resp: 18  Temp: (!) 96.4 F (35.8 C)  TempSrc: Temporal  SpO2: 99%  Weight: 98 kg  Height: '5\' 6"'$  (1.676 m)     Physical Exam Vitals and nursing note reviewed.  Constitutional:      General: She is not in acute distress.    Appearance: Normal appearance. She is obese. She is not ill-appearing, toxic-appearing or diaphoretic.  HENT:     Head: Normocephalic and atraumatic.     Ears:     Comments: Hearing aids    Nose: Nose normal.     Mouth/Throat:     Mouth: Mucous membranes are moist.     Pharynx: Oropharynx is clear.  Eyes:     General: No scleral icterus.    Extraocular Movements: Extraocular movements intact.  Cardiovascular:     Rate and Rhythm: Normal rate and regular rhythm.     Heart sounds: Normal heart sounds. No murmur heard.    No friction rub. No gallop.  Pulmonary:     Effort: Pulmonary effort is normal. No respiratory distress.     Breath sounds: Normal breath sounds. No wheezing, rhonchi or rales.  Abdominal:     General: Bowel sounds are normal. There is no distension.     Palpations: Abdomen is soft.     Tenderness: There is no abdominal tenderness. There is no guarding or rebound.  Musculoskeletal:     Cervical back: Neck supple.     Right lower leg: No edema.     Left lower leg: No edema.  Skin:    General: Skin is warm and dry.     Coloration: Skin is not jaundiced or pale.  Neurological:     General: No focal deficit present.     Mental Status: She is alert and  oriented to person, place, and time. Mental status is at baseline.  Psychiatric:        Mood and Affect: Mood normal.        Behavior: Behavior normal.        Thought Content: Thought content normal.        Judgment: Judgment normal.      Assessment:  Ms. Isabella Luna is a 62 y.o. female  who presents today for Colonoscopy for Screening-family history of colon polyps .  Plan:  Colonoscopy with possible intervention today  Colonoscopy with possible biopsy, control of bleeding, polypectomy, and interventions as necessary has been discussed with the patient/patient representative. Informed consent was obtained from the patient/patient representative after explaining  the indication, nature, and risks of the procedure including but not limited to death, bleeding, perforation, missed neoplasm/lesions, cardiorespiratory compromise, and reaction to medications. Opportunity for questions was given and appropriate answers were provided. Patient/patient representative has verbalized understanding is amenable to undergoing the procedure.   Annamaria Helling, DO  [facility]  Portions of the record may have been created with voice recognition software. Occasional wrong-word or 'sound-a-like' substitutions may have occurred due to the inherent limitations of voice recognition software.  Read the chart carefully and recognize, using context, where substitutions may have occurred.

## 2022-10-25 ENCOUNTER — Ambulatory Visit: Payer: Commercial Managed Care - PPO | Admitting: Anesthesiology

## 2022-10-25 ENCOUNTER — Encounter
Admission: RE | Disposition: A | Discharge status: Home / Self Care | Payer: Self-pay | Source: Ambulatory Visit | Attending: Gastroenterology

## 2022-10-25 ENCOUNTER — Ambulatory Visit
Admission: RE | Admit: 2022-10-25 | Discharge: 2022-10-25 | Disposition: A | Discharge status: Home / Self Care | Payer: Commercial Managed Care - PPO | Source: Ambulatory Visit | Attending: Gastroenterology | Admitting: Gastroenterology

## 2022-10-25 ENCOUNTER — Encounter: Payer: Self-pay | Admitting: Gastroenterology

## 2022-10-25 DIAGNOSIS — K5730 Diverticulosis of large intestine without perforation or abscess without bleeding: Secondary | ICD-10-CM | POA: Insufficient documentation

## 2022-10-25 DIAGNOSIS — Z87891 Personal history of nicotine dependence: Secondary | ICD-10-CM | POA: Diagnosis not present

## 2022-10-25 DIAGNOSIS — D126 Benign neoplasm of colon, unspecified: Secondary | ICD-10-CM | POA: Diagnosis not present

## 2022-10-25 DIAGNOSIS — K635 Polyp of colon: Secondary | ICD-10-CM | POA: Insufficient documentation

## 2022-10-25 DIAGNOSIS — Z90710 Acquired absence of both cervix and uterus: Secondary | ICD-10-CM | POA: Diagnosis not present

## 2022-10-25 DIAGNOSIS — D128 Benign neoplasm of rectum: Secondary | ICD-10-CM | POA: Diagnosis not present

## 2022-10-25 DIAGNOSIS — K5790 Diverticulosis of intestine, part unspecified, without perforation or abscess without bleeding: Secondary | ICD-10-CM | POA: Diagnosis not present

## 2022-10-25 DIAGNOSIS — K640 First degree hemorrhoids: Secondary | ICD-10-CM | POA: Diagnosis not present

## 2022-10-25 DIAGNOSIS — Z96652 Presence of left artificial knee joint: Secondary | ICD-10-CM | POA: Diagnosis not present

## 2022-10-25 DIAGNOSIS — Z800 Family history of malignant neoplasm of digestive organs: Secondary | ICD-10-CM | POA: Insufficient documentation

## 2022-10-25 DIAGNOSIS — K219 Gastro-esophageal reflux disease without esophagitis: Secondary | ICD-10-CM | POA: Diagnosis not present

## 2022-10-25 DIAGNOSIS — Z83719 Family history of colon polyps, unspecified: Secondary | ICD-10-CM | POA: Diagnosis not present

## 2022-10-25 DIAGNOSIS — K649 Unspecified hemorrhoids: Secondary | ICD-10-CM | POA: Diagnosis not present

## 2022-10-25 DIAGNOSIS — E669 Obesity, unspecified: Secondary | ICD-10-CM | POA: Diagnosis not present

## 2022-10-25 DIAGNOSIS — Z6834 Body mass index (BMI) 34.0-34.9, adult: Secondary | ICD-10-CM | POA: Diagnosis not present

## 2022-10-25 DIAGNOSIS — Z1211 Encounter for screening for malignant neoplasm of colon: Secondary | ICD-10-CM | POA: Insufficient documentation

## 2022-10-25 DIAGNOSIS — K621 Rectal polyp: Secondary | ICD-10-CM | POA: Diagnosis not present

## 2022-10-25 HISTORY — PX: COLONOSCOPY WITH PROPOFOL: SHX5780

## 2022-10-25 SURGERY — COLONOSCOPY WITH PROPOFOL
Anesthesia: General

## 2022-10-25 MED ORDER — LIDOCAINE HCL (PF) 2 % IJ SOLN
INTRAMUSCULAR | Status: AC
  Filled 2022-10-25: qty 40

## 2022-10-25 MED ORDER — PROPOFOL 10 MG/ML IV BOLUS
INTRAVENOUS | Status: DC | PRN
  Administered 2022-10-25: 250 ug/kg/min via INTRAVENOUS
  Administered 2022-10-25: 200 ug/kg/min via INTRAVENOUS

## 2022-10-25 MED ORDER — PROPOFOL 1000 MG/100ML IV EMUL
INTRAVENOUS | Status: AC
  Filled 2022-10-25: qty 100

## 2022-10-25 MED ORDER — PHENYLEPHRINE 80 MCG/ML (10ML) SYRINGE FOR IV PUSH (FOR BLOOD PRESSURE SUPPORT)
PREFILLED_SYRINGE | INTRAVENOUS | Status: AC
  Filled 2022-10-25: qty 10

## 2022-10-25 MED ORDER — SODIUM CHLORIDE 0.9 % IV SOLN: INTRAVENOUS | Status: DC

## 2022-10-25 MED ORDER — PROPOFOL 500 MG/50ML IV EMUL
INTRAVENOUS | Status: DC | PRN
  Administered 2022-10-25: 70 mg via INTRAVENOUS

## 2022-10-25 MED ORDER — ALBUTEROL SULFATE HFA 108 (90 BASE) MCG/ACT IN AERS
INHALATION_SPRAY | RESPIRATORY_TRACT | Status: DC | PRN
  Administered 2022-10-25: 4 via RESPIRATORY_TRACT

## 2022-10-25 MED ORDER — DEXMEDETOMIDINE HCL IN NACL 80 MCG/20ML IV SOLN
INTRAVENOUS | Status: AC
  Filled 2022-10-25: qty 40

## 2022-10-25 MED ORDER — DEXMEDETOMIDINE HCL IN NACL 200 MCG/50ML IV SOLN
INTRAVENOUS | Status: DC | PRN
  Administered 2022-10-25 (×2): 4 ug via INTRAVENOUS

## 2022-10-25 MED ORDER — LIDOCAINE HCL (CARDIAC) PF 100 MG/5ML IV SOSY
PREFILLED_SYRINGE | INTRAVENOUS | Status: DC | PRN
  Administered 2022-10-25: 100 mg via INTRAVENOUS

## 2022-10-25 NOTE — Interval H&P Note (Signed)
History and Physical Interval Note: Preprocedure H&P from 10/25/22  was reviewed and there was no interval change after seeing and examining the patient.  Written consent was obtained from the patient after discussion of risks, benefits, and alternatives. Patient has consented to proceed with Colonoscopy with possible intervention   10/25/2022 7:37 AM  Isabella Luna  has presented today for surgery, with the diagnosis of Z12.11  - Colon cancer screening Z80.0  - Family history of colon cancer Z83.719  - Family history of colonic polyps.  The various methods of treatment have been discussed with the patient and family. After consideration of risks, benefits and other options for treatment, the patient has consented to  Procedure(s): COLONOSCOPY WITH PROPOFOL (N/A) as a surgical intervention.  The patient's history has been reviewed, patient examined, no change in status, stable for surgery.  I have reviewed the patient's chart and labs.  Questions were answered to the patient's satisfaction.     Annamaria Helling

## 2022-10-25 NOTE — Anesthesia Preprocedure Evaluation (Signed)
Anesthesia Evaluation  Patient identified by MRN, date of birth, ID band Patient awake    Reviewed: Allergy & Precautions, NPO status , Patient's Chart, lab work & pertinent test results  History of Anesthesia Complications (+) PONV and history of anesthetic complications  Airway Mallampati: III  TM Distance: <3 FB Neck ROM: full    Dental  (+) Teeth Intact   Pulmonary neg pulmonary ROS, Patient abstained from smoking., former smoker   Pulmonary exam normal breath sounds clear to auscultation       Cardiovascular Exercise Tolerance: Good negative cardio ROS Normal cardiovascular exam Rhythm:Regular Rate:Normal     Neuro/Psych TIAnegative neurological ROS  negative psych ROS   GI/Hepatic negative GI ROS, Neg liver ROS,GERD  Medicated,,  Endo/Other  negative endocrine ROS    Renal/GU negative Renal ROS  negative genitourinary   Musculoskeletal   Abdominal  (+) + obese  Peds negative pediatric ROS (+)  Hematology negative hematology ROS (+)   Anesthesia Other Findings Past Medical History: No date: Arthritis No date: Cancer ([facility])     Comment:  skin No date: GERD (gastroesophageal reflux disease) No date: Mood changes No date: PONV (postoperative nausea and vomiting)     Comment:  after Hysterectomy  No date: Wears hearing aid in both ears  Past Surgical History: No date: ABDOMINAL HYSTERECTOMY No date: BLADDER SUSPENSION No date: BUNIONECTOMY; Bilateral No date: COLONOSCOPY WITH ESOPHAGOGASTRODUODENOSCOPY (EGD) 05/04/2017: KNEE ARTHROSCOPY WITH MEDIAL MENISECTOMY; Left     Comment:  Procedure: Arthroscopic medial meniscus repair and               arthroscopic partial lateral meniscectomy, right knee. ;               Surgeon: Corky Mull, MD;  Location: [facility];  Service: Orthopedics;  Laterality: Left; 03/07/2018: TOTAL KNEE ARTHROPLASTY; Left     Comment:  Procedure: TOTAL KNEE  ARTHROPLASTY;  Surgeon: Corky Mull, MD;  Location: [facility];  Service: Orthopedics;                Laterality: Left;     Reproductive/Obstetrics negative OB ROS                             Anesthesia Physical Anesthesia Plan  ASA: 2  Anesthesia Plan: General   Post-op Pain Management:    Induction: Intravenous  PONV Risk Score and Plan: Propofol infusion and TIVA  Airway Management Planned: Natural Airway  Additional Equipment:   Intra-op Plan:   Post-operative Plan:   Informed Consent: I have reviewed the patients History and Physical, chart, labs and discussed the procedure including the risks, benefits and alternatives for the proposed anesthesia with the patient or authorized representative who has indicated his/her understanding and acceptance.     Dental Advisory Given  Plan Discussed with: Anesthesiologist, CRNA and Surgeon  Anesthesia Plan Comments:        Anesthesia Quick Evaluation

## 2022-10-25 NOTE — Anesthesia Postprocedure Evaluation (Signed)
Anesthesia Post Note  Patient: Isabella Luna  Procedure(s) Performed: COLONOSCOPY WITH PROPOFOL  Patient location during evaluation: PACU Anesthesia Type: General Level of consciousness: awake Pain management: satisfactory to patient Respiratory status: spontaneous breathing and respiratory function stable Cardiovascular status: stable Anesthetic complications: no  There were no known notable events for this encounter.   Last Vitals:  Vitals:   10/25/22 0707 10/25/22 0817  BP: 133/68 (!) 91/59  Pulse: 73 69  Resp: 18 18  Temp: (!) 35.8 C (!) 35.6 C  SpO2: 99% 97%    Last Pain:  Vitals:   10/25/22 0817  TempSrc: Temporal                 VAN STAVEREN,Ayisha Pol

## 2022-10-25 NOTE — Transfer of Care (Signed)
Immediate Anesthesia Transfer of Care Note  Patient: Isabella Luna  Procedure(s) Performed: COLONOSCOPY WITH PROPOFOL  Patient Location: PACU  Anesthesia Type:General  Level of Consciousness: drowsy  Airway & Oxygen Therapy: Pt remains spontaneously breathing with oral airway and Lee oxygen. HOB elevated with full body Reverse Tbird on stretcher. Side lying with back roll for support. Sats 97%.  Post-op Assessment: Report given to RN and Post -op Vital signs reviewed and stable  Post vital signs: stable  Last Vitals:  Vitals Value Taken Time  BP 91/59 10/25/22 0819  Temp 35.6 C 10/25/22 0817  Pulse 66 10/25/22 0820  Resp 13 10/25/22 0820  SpO2 96 % 10/25/22 0820  Vitals shown include unvalidated device data.  Last Pain:  Vitals:   10/25/22 0817  TempSrc: Temporal         Complications: No notable events documented.

## 2022-10-25 NOTE — Op Note (Signed)
[facility] Gastroenterology Patient Name: Isabella Luna Procedure Date: 10/25/2022 7:23 AM MRN: [id number] Account #: [id number] Date of Birth: 1961/06/20 Admit Type: Outpatient Age: 62 Room: [facility] ENDO ROOM 2 Gender: Female Note Status: Finalized Instrument Name: Jasper Riling [id number] Procedure:             Colonoscopy Indications:           Screening for colorectal malignant neoplasm, Screening                         patient at increased risk: Family history of                         1st-degree relative with colorectal cancer at age 66                         years (or older) Providers:             Rueben Bash, DO Referring MD:          Rusty Aus, MD (Referring MD) Medicines:             Monitored Anesthesia Care Complications:         No immediate complications. Estimated blood loss:                         Minimal. Procedure:             Pre-Anesthesia Assessment:                        - Prior to the procedure, a History and Physical was                         performed, and patient medications and allergies were                         reviewed. The patient is competent. The risks and                         benefits of the procedure and the sedation options and                         risks were discussed with the patient. All questions                         were answered and informed consent was obtained.                         Patient identification and proposed procedure were                         verified by the physician, the nurse, the anesthetist                         and the technician in the endoscopy suite. Mental                         Status Examination: alert and oriented. Airway  Examination: normal oropharyngeal airway and neck                         mobility. Respiratory Examination: clear to                         auscultation. CV Examination: RRR, no murmurs, no S3                          or S4. Prophylactic Antibiotics: The patient does not                         require prophylactic antibiotics. Prior                         Anticoagulants: The patient has taken no anticoagulant                         or antiplatelet agents. ASA Grade Assessment: II - A                         patient with mild systemic disease. After reviewing                         the risks and benefits, the patient was deemed in                         satisfactory condition to undergo the procedure. The                         anesthesia plan was to use monitored anesthesia care                         (MAC). Immediately prior to administration of                         medications, the patient was re-assessed for adequacy                         to receive sedatives. The heart rate, respiratory                         rate, oxygen saturations, blood pressure, adequacy of                         pulmonary ventilation, and response to care were                         monitored throughout the procedure. The physical                         status of the patient was re-assessed after the                         procedure.                        After obtaining informed consent, the colonoscope was  passed under direct vision. Throughout the procedure,                         the patient's blood pressure, pulse, and oxygen                         saturations were monitored continuously. The                         Colonoscope was introduced through the anus and                         advanced to the the terminal ileum, with                         identification of the appendiceal orifice and IC                         valve. The colonoscopy was performed without                         difficulty. The patient tolerated the procedure well.                         The quality of the bowel preparation was evaluated                         using the BBPS [facility] Bowel Preparation  Scale) with                         scores of: Right Colon = 3, Transverse Colon = 3 and                         Left Colon = 3 (entire mucosa seen well with no                         residual staining, small fragments of stool or opaque                         liquid). The total BBPS score equals 9. The terminal                         ileum, ileocecal valve, appendiceal orifice, and                         rectum were photographed. Findings:      The perianal and digital rectal examinations were normal. Pertinent       negatives include normal sphincter tone.      The terminal ileum appeared normal. Estimated blood loss: none.      A few small-mouthed diverticula were found in the sigmoid colon.       Estimated blood loss: none.      Non-bleeding internal hemorrhoids were found during retroflexion. The       hemorrhoids were Grade I (internal hemorrhoids that do not prolapse).       Estimated blood loss: none.      Six sessile polyps were found in the rectum (2), descending colon (3)  and transverse colon (1). The polyps were 1 to 2 mm in size. These       polyps were removed with a jumbo cold forceps. Resection and retrieval       were complete. Estimated blood loss was minimal.      The exam was otherwise without abnormality on direct and retroflexion       views. Impression:            - The examined portion of the ileum was normal.                        - Diverticulosis in the sigmoid colon.                        - Non-bleeding internal hemorrhoids.                        - Six 1 to 2 mm polyps in the rectum, in the                         descending colon and in the transverse colon, removed                         with a jumbo cold forceps. Resected and retrieved.                        - The examination was otherwise normal on direct and                         retroflexion views. Recommendation:        - Patient has a contact number available for                          emergencies. The signs and symptoms of potential                         delayed complications were discussed with the patient.                         Return to normal activities tomorrow. Written                         discharge instructions were provided to the patient.                        - Discharge patient to home.                        - Resume previous diet.                        - Continue present medications.                        - Await pathology results.                        - Repeat colonoscopy for surveillance based on  pathology results.                        - Return to referring physician as previously                         scheduled.                        - The findings and recommendations were discussed with                         the patient. Procedure Code(s):     --- Professional ---                        [id number], Colonoscopy, flexible; with biopsy, single or                         multiple Diagnosis Code(s):     --- Professional ---                        Z12.11, Encounter for screening for malignant neoplasm                         of colon                        Z80.0, Family history of malignant neoplasm of                         digestive organs                        K64.0, First degree hemorrhoids                        D12.8, Benign neoplasm of rectum                        D12.4, Benign neoplasm of descending colon                        D12.3, Benign neoplasm of transverse colon (hepatic                         flexure or splenic flexure)                        K57.30, Diverticulosis of large intestine without                         perforation or abscess without bleeding CPT copyright 2022 American Medical Association. All rights reserved. The codes documented in this report are preliminary and upon coder review may  be revised to meet current compliance requirements. Attending Participation:      I personally  performed the entire procedure. Volney American, DO Annamaria Helling DO, DO 10/25/2022 8:19:44 AM This report has been signed electronically. Number of Addenda: 0 Note Initiated On: 10/25/2022 7:23 AM Scope Withdrawal Time: 0 hours 17 minutes 45 seconds  Total Procedure Duration: 0 hours 24 minutes 57 seconds  Estimated Blood Loss:  Estimated blood loss was minimal.  [facility]

## 2022-10-26 ENCOUNTER — Encounter: Payer: Self-pay | Admitting: Gastroenterology

## 2022-10-26 LAB — SURGICAL PATHOLOGY

## 2022-10-28 ENCOUNTER — Encounter (HOSPITAL_COMMUNITY): Payer: Self-pay

## 2022-10-28 ENCOUNTER — Ambulatory Visit (HOSPITAL_COMMUNITY)
Admission: RE | Admit: 2022-10-28 | Discharge: 2022-10-28 | Disposition: A | Discharge status: Home / Self Care | Payer: Commercial Managed Care - PPO | Source: Ambulatory Visit | Attending: Family Medicine | Admitting: Family Medicine

## 2022-10-28 VITALS — BP 108/73 | HR 70 | Temp 98.2°F | Resp 17

## 2022-10-28 DIAGNOSIS — H6691 Otitis media, unspecified, right ear: Secondary | ICD-10-CM | POA: Diagnosis not present

## 2022-10-28 DIAGNOSIS — J019 Acute sinusitis, unspecified: Secondary | ICD-10-CM | POA: Diagnosis not present

## 2022-10-28 MED ORDER — BENZONATATE 100 MG PO CAPS: 100.0000 mg | ORAL_CAPSULE | Freq: Three times a day (TID) | ORAL | 0 refills | Status: DC | PRN

## 2022-10-28 MED ORDER — CEFDINIR 300 MG PO CAPS: 600.0000 mg | ORAL_CAPSULE | Freq: Every day | ORAL | 0 refills | Status: AC

## 2022-10-28 NOTE — ED Triage Notes (Signed)
Pt reports cough that is pretty dry during the day for 2 weeks. Productive cough in mornings. Reports taken Mucinex.  Started having right ear pains. Today has hoarse voice.

## 2022-10-28 NOTE — Discharge Instructions (Signed)
Take cefdinir 300 mg--2 capsules together daily for 7 days  Take benzonatate 100 mg, 1 tab every 8 hours as needed for cough.

## 2022-10-28 NOTE — ED Provider Notes (Signed)
[place]    CSN: [id number] Arrival date & time: 10/28/22  1647      History   Chief Complaint Chief Complaint  Patient presents with   Cough    Ear pain, and sore throat. - Entered by patient    HPI Isabella Luna is a 62 y.o. female.    Cough  Here for congestion and cough that is been going on for 2 weeks.  She has had a lot of postnasal drainage and has increased in the last few days.  She does cough a good bit at night. Then this morning she woke up with right ear pain.  She is also had hoarseness coming and going.  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 ;  Surgeon: Corky Mull, MD;  Location: [place];  Service: Orthopedics;  Laterality: Left;   TOTAL KNEE ARTHROPLASTY Left 03/07/2018   Procedure: TOTAL KNEE ARTHROPLASTY;  Surgeon: Corky Mull, MD;  Location: [facility];  Service: Orthopedics;  Laterality: Left;    OB History   No obstetric history on file.      Home Medications    Prior to Admission medications    Medication Sig Start Date End Date Taking? Authorizing Provider  benzonatate (TESSALON) 100 MG capsule Take 1 capsule (100 mg total) by mouth 3 (three) times daily as needed for cough. 10/28/22  Yes Barrett Henle, MD  cefdinir (OMNICEF) 300 MG capsule Take 2 capsules (600 mg total) by mouth daily for 7 days. 10/28/22 11/04/22 Yes Barrett Henle, MD  aspirin EC 81 MG EC tablet Take 1 tablet (81 mg total) by mouth daily. Swallow whole. Patient not taking: Reported on 10/25/2022 04/11/21   Antonieta Pert, MD  cholecalciferol (VITAMIN D3) 25 MCG (1000 UNIT) tablet Take 1,000 Units by mouth daily.    [provider]  citalopram (CELEXA) 20 MG tablet Take 20 mg by mouth daily.    [provider]  citalopram (CELEXA) 20 MG tablet Take 1 tablet (20 mg total) by mouth daily. 05/03/22     Cyanocobalamin 1000 MCG/ML LIQD Place 1,000 mcg under the tongue every other day.    [provider]  hydrochlorothiazide (HYDRODIURIL) 25 MG tablet Take 25 mg by mouth daily.    [provider]  hydrochlorothiazide (HYDRODIURIL) 25 MG tablet Take 1 tablet (25 mg total) by mouth daily. 05/03/22     Na Sulfate-K Sulfate-Mg Sulf 17.5-3.13-1.6 GM/177ML SOLN Take both bottles at the times instructed by your provider. 07/12/22  omeprazole (PRILOSEC) 40 MG capsule Take 40 mg by mouth daily.    [provider]  simvastatin (ZOCOR) 20 MG tablet Take 20 mg by mouth at bedtime.    [provider]  simvastatin (ZOCOR) 20 MG tablet Take 1 tablet (20 mg total) by mouth daily. 05/03/22     valACYclovir (VALTREX) 1000 MG tablet Take 1,000 mg by mouth 2 (two) times daily as needed (for cold sores).    [provider]    Family History Family History  Problem Relation Age of Onset   Breast cancer Neg Hx     Social History Social History   Tobacco Use   Smoking status: Former    Types: Cigarettes    Quit date: 04/1992    Years since quitting: 30.5   Smokeless  tobacco: Never  Vaping Use   Vaping Use: Never used  Substance Use Topics   Alcohol use: Yes    Comment: rare   Drug use: Never     Allergies   Cortisone   Review of Systems Review of Systems  Respiratory:  Positive for cough.      Physical Exam Triage Vital Signs ED Triage Vitals [10/28/22 1725]  Enc Vitals Group     BP 108/73     Pulse Rate 70     Resp 17     Temp 98.2 F (36.8 C)     Temp Source Oral     SpO2 97 %     Weight      Height      Head Circumference      Peak Flow      Pain Score 2     Pain Loc      Pain Edu?      Excl. in [place]?    No data found.  Updated Vital Signs BP 108/73 (BP Location: Right Arm)   Pulse 70   Temp 98.2 F (36.8 C) (Oral)   Resp 17   SpO2 97%   Visual Acuity Right Eye Distance:   Left Eye Distance:   Bilateral Distance:    Right Eye Near:   Left Eye Near:    Bilateral Near:     Physical Exam Vitals reviewed.  Constitutional:      General: She is not in acute distress.    Appearance: She is not toxic-appearing.  HENT:     Right Ear: Ear canal normal.     Left Ear: Tympanic membrane and ear canal normal.     Ears:     Comments: Right tympanic membrane is erythematous and dull on the superior part.  Left tympanic membrane is gray and shiny    Nose: Nose normal.     Mouth/Throat:     Mouth: Mucous membranes are moist.     Comments: There is mild erythema of the posterior oropharynx.  Clear mucus is draining. Eyes:     Extraocular Movements: Extraocular movements intact.     Conjunctiva/sclera: Conjunctivae normal.     Pupils: Pupils are equal, round, and reactive to light.  Cardiovascular:     Rate and Rhythm: Normal rate and regular rhythm.     Heart sounds: No murmur heard. Pulmonary:     Effort: Pulmonary effort is normal. No respiratory distress.     Breath sounds: No wheezing, rhonchi or rales.  Chest:     Chest wall: No tenderness.  Musculoskeletal:     Cervical back: Neck supple.   Lymphadenopathy:     Cervical: No  cervical adenopathy.  Skin:    Capillary Refill: Capillary refill takes less than 2 seconds.     Coloration: Skin is not jaundiced or pale.  Neurological:     General: No focal deficit present.     Mental Status: She is alert and oriented to person, place, and time.  Psychiatric:        Behavior: Behavior normal.      UC Treatments / Results  Labs (all labs ordered are listed, but only abnormal results are displayed) Labs Reviewed - No data to display  EKG   Radiology No results found.  Procedures Procedures (including critical care time)  Medications Ordered in UC Medications - No data to display  Initial Impression / Assessment and Plan / UC Course  I have reviewed the triage vital signs and the nursing notes.  Pertinent labs & imaging results that were available during my care of the patient were reviewed by me and considered in my medical decision making (see chart for details).        Isabella Luna is sent to treat the ear infection and the possible sinusitis. Final Clinical Impressions(s) / UC Diagnoses   Final diagnoses:  Right otitis media, unspecified otitis media type  Acute sinusitis, recurrence not specified, unspecified location     Discharge Instructions      Take cefdinir 300 mg--2 capsules together daily for 7 days  Take benzonatate 100 mg, 1 tab every 8 hours as needed for cough.       ED Prescriptions     Medication Sig Dispense Auth. Provider   cefdinir (OMNICEF) 300 MG capsule Take 2 capsules (600 mg total) by mouth daily for 7 days. 14 capsule Hasnain Manheim, Gwenlyn Perking, MD   benzonatate (TESSALON) 100 MG capsule Take 1 capsule (100 mg total) by mouth 3 (three) times daily as needed for cough. 21 capsule Barrett Henle, MD      PDMP not reviewed this encounter.   Barrett Henle, MD 10/28/22 [phone]

## 2022-11-09 DIAGNOSIS — D2261 Melanocytic nevi of right upper limb, including shoulder: Secondary | ICD-10-CM | POA: Diagnosis not present

## 2022-11-09 DIAGNOSIS — B001 Herpesviral vesicular dermatitis: Secondary | ICD-10-CM | POA: Diagnosis not present

## 2022-11-09 DIAGNOSIS — D485 Neoplasm of uncertain behavior of skin: Secondary | ICD-10-CM | POA: Diagnosis not present

## 2022-11-09 DIAGNOSIS — L820 Inflamed seborrheic keratosis: Secondary | ICD-10-CM | POA: Diagnosis not present

## 2022-11-09 DIAGNOSIS — D2262 Melanocytic nevi of left upper limb, including shoulder: Secondary | ICD-10-CM | POA: Diagnosis not present

## 2022-11-09 DIAGNOSIS — Z85828 Personal history of other malignant neoplasm of skin: Secondary | ICD-10-CM | POA: Diagnosis not present

## 2022-11-09 DIAGNOSIS — D2272 Melanocytic nevi of left lower limb, including hip: Secondary | ICD-10-CM | POA: Diagnosis not present

## 2022-11-11 IMAGING — CT CT ANGIO HEAD-NECK (W OR W/O PERF)
1 of 10 series · 6 of 34 positions shown · IV contrast (omnipaque)
Comparison: CT head and MRI/MRA 04/09/2021

CLINICAL DATA: Right arm tingling, blurry vision, possible abnormal
right vertebral artery on MRA

EXAM:
CT ANGIOGRAPHY HEAD AND NECK
TECHNIQUE: Multidetector CT imaging of the head and neck was performed using
the standard protocol during bolus administration of intravenous
contrast. Multiplanar CT image reconstructions and MIPs were
obtained to evaluate the vascular anatomy. Carotid stenosis
measurements (when applicable) are obtained utilizing NASCET
criteria, using the distal internal carotid diameter as the
denominator.
CONTRAST:  75mL OMNIPAQUE IOHEXOL 350 MG/ML SOLN

[Series 10: ax thin · axial · 0.39mm/px · z∈[-233,+7]mm · 6 of 337 slices shown]
[im 49/337  soft-tissue]
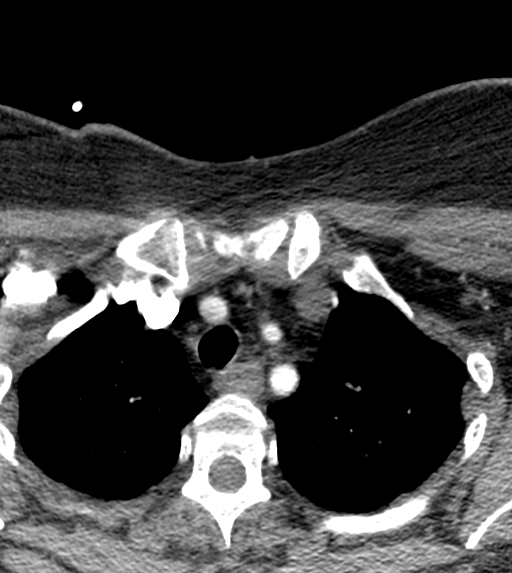
[im 97/337  bone]
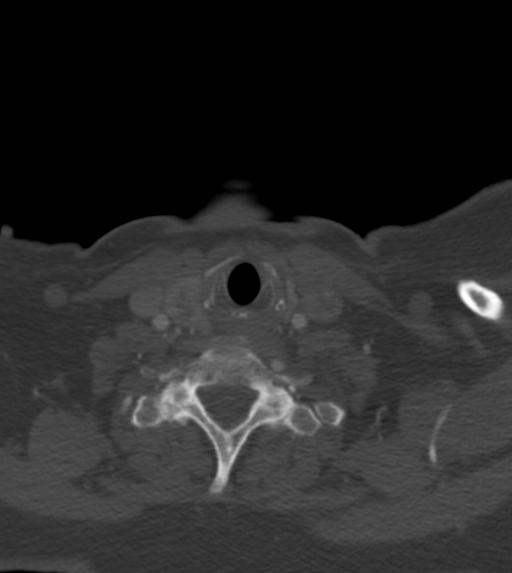
[im 145/337  soft-tissue]
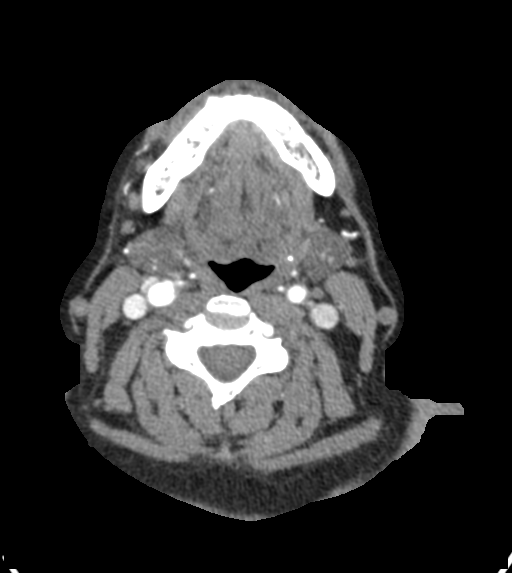
[im 193/337  bone]
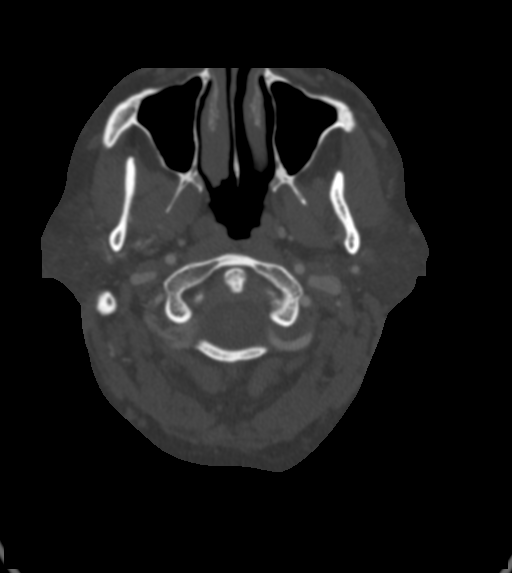
[im 241/337  soft-tissue]
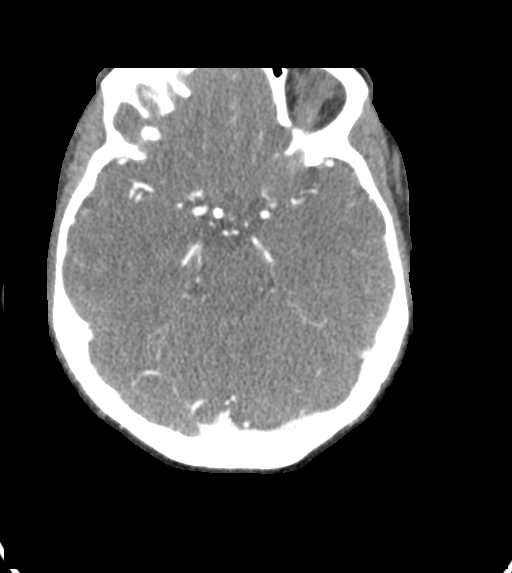
[im 289/337  bone]
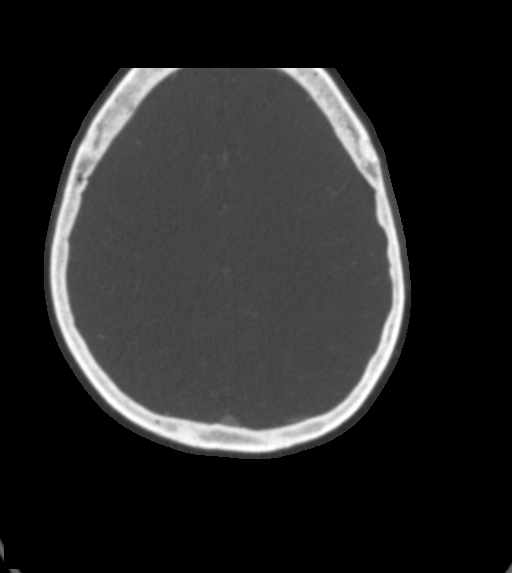

[6 of 34 positions shown; findings below may reference images not displayed]

FINDINGS: CT HEAD

Brain: There is no acute intracranial hemorrhage, mass effect, or
edema. Gray-white differentiation is preserved. There is no
extra-axial fluid collection. Ventricles and sulci are within normal
limits in size and configuration.

Vascular: No new finding.

Skull: Calvarium is unremarkable.

Sinuses/Orbits: No acute finding.

Other: Bilateral mastoid effusions.

Review of the MIP images confirms the above findings

CTA NECK

Aortic arch: Great vessel origins are patent.

Right carotid system: Patent.  No stenosis.

Left carotid system: Patent. Trace calcified plaque along the
proximal internal carotid. No stenosis.

Vertebral arteries: Dominant left vertebral artery is patent. Right
vertebral artery is also patent. The proximal V1 segment is not as
well evaluated due to streak artifact

Skeleton: Degenerative changes of the cervical spine primarily at
C4-C5, C5-C6, and C6-C7.

Other neck: Multinodular thyroid. Nodule on the right measures
cm.

Upper chest: Included upper lungs are clear.

Review of the MIP images confirms the above findings

CTA HEAD

Anterior circulation: Intracranial internal carotid arteries are
patent with mild atherosclerotic irregularity along the cavernous
segments. Anterior and middle cerebral arteries are patent.

Posterior circulation: Intracranial vertebral arteries are patent.
Basilar artery is patent. Major cerebellar artery origins are
patent. Small bilateral posterior communicating arteries are
present. Posterior cerebral arteries are patent.

Venous sinuses: Patent as allowed by contrast bolus timing.

Review of the MIP images confirms the above findings
IMPRESSION: Right vertebral artery is patent.

No large vessel occlusion or hemodynamically significant stenosis.

Multinodular thyroid. Largest on the right measures 1.9 cm.
Outpatient ultrasound evaluation is recommended if not previously
performed.

## 2023-04-07 ENCOUNTER — Ambulatory Visit
Admission: RE | Admit: 2023-04-07 | Discharge: 2023-04-07 | Disposition: A | Discharge status: Home / Self Care | Payer: BC Managed Care – PPO | Source: Ambulatory Visit | Attending: Emergency Medicine | Admitting: Emergency Medicine

## 2023-04-07 ENCOUNTER — Ambulatory Visit (INDEPENDENT_AMBULATORY_CARE_PROVIDER_SITE_OTHER): Payer: BC Managed Care – PPO

## 2023-04-07 VITALS — BP 136/82 | HR 81 | Temp 97.6°F | Resp 18

## 2023-04-07 DIAGNOSIS — M79672 Pain in left foot: Secondary | ICD-10-CM

## 2023-04-07 NOTE — ED Triage Notes (Signed)
Patient to Urgent Care with complaints of left sided foot pain. Unknown injury. Reports waking up with symptoms two days ago. Describes an aching pain across the top of her foot/ tenderness to the touch.  Taking tylenol/ icing foot.

## 2023-04-07 NOTE — Discharge Instructions (Addendum)
Take ibuprofen as directed.  Rest and elevate your foot.  Apply ice packs 2-3 times a day for up to 20 minutes each.  Wear the ace wrap as needed for comfort.    Follow up with your primary care provider if your symptoms are not improving.

## 2023-04-07 NOTE — ED Provider Notes (Signed)
Isabella Luna    CSN: [id number] Arrival date & time: 04/07/23  1141      History   Chief Complaint Chief Complaint  Patient presents with   Foot Pain    Entered by patient    HPI Isabella Luna is a 62 y.o. female.  Patient presents with pain and swelling of the top of her left foot x 2 days.  No known injury.  The pain started when she was walking at work.  She noticed a knot and bruising on the top of her foot today.  Treatment with Tylenol and ice packs.  No numbness, weakness, wounds, redness, fever, or other symptoms.  Patient had a telemedicine visit yesterday for UTI symptoms and was prescribed cephalexin.  The history is provided by the patient and medical records.    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 ;  Surgeon: Christena Flake, MD;  Location: [facility];  Service: Orthopedics;  Laterality: Left;   TOTAL KNEE ARTHROPLASTY Left 03/07/2018   Procedure: TOTAL KNEE ARTHROPLASTY;  Surgeon: Christena Flake, MD;  Location: [facility] ORS;  Service: Orthopedics;   Laterality: Left;    OB History   No obstetric history on file.      Home Medications    Prior to Admission medications   Medication Sig Start Date End Date Taking? Authorizing Provider  aspirin EC 81 MG EC tablet Take 1 tablet (81 mg total) by mouth daily. Swallow whole. Patient not taking: Reported on 10/25/2022 04/11/21   Lanae Boast, MD  benzonatate (TESSALON) 100 MG capsule Take 1 capsule (100 mg total) by mouth 3 (three) times daily as needed for cough. 10/28/22   Zenia Resides, MD  cholecalciferol (VITAMIN D3) 25 MCG (1000 UNIT) tablet Take 1,000 Units by mouth daily.    [provider]  citalopram (CELEXA) 20 MG tablet Take 20 mg by mouth daily.    [provider]  citalopram (CELEXA) 20 MG tablet Take 1 tablet (20 mg total) by mouth daily. 05/03/22     Cyanocobalamin 1000 MCG/ML LIQD Place 1,000 mcg under the tongue every other day.    [provider]  hydrochlorothiazide (HYDRODIURIL) 25 MG tablet Take 25 mg by mouth daily.    [provider]  hydrochlorothiazide (HYDRODIURIL) 25 MG tablet Take 1 tablet (25 mg total) by mouth daily. 05/03/22     omeprazole (PRILOSEC) 40 MG capsule Take 40 mg by mouth daily.    [provider]  simvastatin (ZOCOR) 20 MG tablet Take 20 mg by mouth  at bedtime.    [provider]  simvastatin (ZOCOR) 20 MG tablet Take 1 tablet (20 mg total) by mouth daily. 05/03/22     valACYclovir (VALTREX) 1000 MG tablet Take 1,000 mg by mouth 2 (two) times daily as needed (for cold sores).    [provider]    Family History Family History  Problem Relation Age of Onset   Breast cancer Neg Hx     Social History Social History   Tobacco Use   Smoking status: Former    Types: Cigarettes    Quit date: 04/1992    Years since quitting: 31.0   Smokeless tobacco: Never  Vaping Use   Vaping Use: Never used  Substance Use Topics   Alcohol use: Yes    Comment: rare   Drug use: Never      Allergies   Cortisone   Review of Systems Review of Systems  Constitutional:  Negative for chills and fever.  Respiratory:  Negative for cough and shortness of breath.   Cardiovascular:  Negative for chest pain and palpitations.  Musculoskeletal:  Positive for arthralgias and joint swelling. Negative for gait problem.  Skin:  Positive for color change. Negative for rash and wound.  Neurological:  Negative for weakness and numbness.  All other systems reviewed and are negative.    Physical Exam Triage Vital Signs ED Triage Vitals  Enc Vitals Group     BP      Pulse      Resp      Temp      Temp src      SpO2      Weight      Height      Head Circumference      Peak Flow      Pain Score      Pain Loc      Pain Edu?      Excl. in GC?    No data found.  Updated Vital Signs BP 136/82   Pulse 81   Temp 97.6 F (36.4 C)   Resp 18   SpO2 96%   Visual Acuity Right Eye Distance:   Left Eye Distance:   Bilateral Distance:    Right Eye Near:   Left Eye Near:    Bilateral Near:     Physical Exam Constitutional:      General: She is not in acute distress. HENT:     Mouth/Throat:     Mouth: Mucous membranes are moist.  Cardiovascular:     Rate and Rhythm: Normal rate and regular rhythm.     Heart sounds: Normal heart sounds.  Pulmonary:     Effort: Pulmonary effort is normal. No respiratory distress.     Breath sounds: Normal breath sounds.  Musculoskeletal:        General: Swelling and tenderness present. No deformity. Normal range of motion.       Feet:  Skin:    General: Skin is warm and dry.     Findings: Bruising present. No erythema, lesion or rash.  Neurological:     General: No focal deficit present.     Mental Status: She is alert and oriented to person, place, and time.     Sensory: No sensory deficit.     Motor: No weakness.     Gait: Gait normal.  Psychiatric:        Mood and Affect: Mood normal.        Behavior: Behavior normal.  UC Treatments / Results  Labs (all labs ordered are listed, but only abnormal results are displayed) Labs Reviewed - No data to display  EKG   Radiology DG Foot Complete Left  Result Date: 04/07/2023 CLINICAL DATA:  Pain and swelling.  Pain across dorsum of foot. EXAM: LEFT FOOT - COMPLETE 3+ VIEW COMPARISON:  None Available. FINDINGS: No signs of acute fracture or dislocation. Mild degenerative changes scratch degenerative changes noted at the first MTP joint. Mild soft tissue edema. IMPRESSION: No fracture or dislocation. Mild soft tissue edema. Electronically Signed   By: Signa Kell M.D.   On: 04/07/2023 12:15    Procedures Procedures (including critical care time)  Medications Ordered in UC Medications - No data to display  Initial Impression / Assessment and Plan / UC Course  I have reviewed the triage vital signs and the nursing notes.  Pertinent labs & imaging results that were available during my care of the patient were reviewed by me and considered in my medical decision making (see chart for details).    Left foot pain.  X-ray shows no acute bony abnormality.  Treating with rest, elevation, ice packs, ibuprofen, ace wrap.  Education provided on foot pain.  Instructed patient to follow up with her PCP if her symptoms are not improving.  She agrees to plan of care.    Final Clinical Impressions(s) / UC Diagnoses   Final diagnoses:  Left foot pain     Discharge Instructions      Take ibuprofen as directed.  Rest and elevate your foot.  Apply ice packs 2-3 times a day for up to 20 minutes each.  Wear the ace wrap as needed for comfort.    Follow up with your primary care provider if your symptoms are not improving.         ED Prescriptions   None    PDMP not reviewed this encounter.   Mickie Bail, NP 04/07/23 1229

## 2023-06-02 ENCOUNTER — Other Ambulatory Visit: Payer: Self-pay | Admitting: Student

## 2023-06-02 DIAGNOSIS — M1711 Unilateral primary osteoarthritis, right knee: Secondary | ICD-10-CM

## 2023-06-02 DIAGNOSIS — S83241D Other tear of medial meniscus, current injury, right knee, subsequent encounter: Secondary | ICD-10-CM

## 2023-06-08 ENCOUNTER — Encounter: Payer: Self-pay | Admitting: Student

## 2023-06-18 ENCOUNTER — Ambulatory Visit
Admission: RE | Admit: 2023-06-18 | Discharge: 2023-06-18 | Disposition: A | Discharge status: Home / Self Care | Payer: BC Managed Care – PPO | Source: Ambulatory Visit | Attending: Student

## 2023-06-18 DIAGNOSIS — S83241D Other tear of medial meniscus, current injury, right knee, subsequent encounter: Secondary | ICD-10-CM

## 2023-06-18 DIAGNOSIS — M1711 Unilateral primary osteoarthritis, right knee: Secondary | ICD-10-CM

## 2023-08-03 ENCOUNTER — Other Ambulatory Visit: Payer: Self-pay | Admitting: Surgery

## 2023-08-10 ENCOUNTER — Encounter
Admission: RE | Admit: 2023-08-10 | Discharge: 2023-08-10 | Disposition: A | Discharge status: Home / Self Care | Payer: BC Managed Care – PPO | Source: Ambulatory Visit | Attending: Surgery | Admitting: Surgery

## 2023-08-10 ENCOUNTER — Other Ambulatory Visit: Payer: Self-pay

## 2023-08-10 VITALS — BP 123/68 | HR 63 | Temp 98.2°F | Resp 18 | Ht 64.0 in | Wt 232.1 lb

## 2023-08-10 DIAGNOSIS — Z01818 Encounter for other preprocedural examination: Secondary | ICD-10-CM | POA: Diagnosis not present

## 2023-08-10 DIAGNOSIS — Z01812 Encounter for preprocedural laboratory examination: Secondary | ICD-10-CM

## 2023-08-10 DIAGNOSIS — Z01810 Encounter for preprocedural cardiovascular examination: Secondary | ICD-10-CM | POA: Diagnosis present

## 2023-08-10 HISTORY — DX: Nontoxic multinodular goiter: E04.2

## 2023-08-10 HISTORY — DX: Depression, unspecified: F32.A

## 2023-08-10 HISTORY — DX: Hyperlipidemia, unspecified: E78.5

## 2023-08-10 HISTORY — DX: Sleep apnea, unspecified: G47.30

## 2023-08-10 LAB — URINALYSIS, ROUTINE W REFLEX MICROSCOPIC
Bilirubin Urine: NEGATIVE
Glucose, UA: NEGATIVE mg/dL
Hgb urine dipstick: NEGATIVE
Ketones, ur: NEGATIVE mg/dL
Leukocytes,Ua: NEGATIVE
Nitrite: NEGATIVE
Protein, ur: NEGATIVE mg/dL
Specific Gravity, Urine: 1.017 (ref 1.005–1.030)
pH: 8 (ref 5.0–8.0)

## 2023-08-10 LAB — CBC WITH DIFFERENTIAL/PLATELET
Abs Immature Granulocytes: 0.02 10*3/uL (ref 0.00–0.07)
Basophils Absolute: 0.1 10*3/uL (ref 0.0–0.1)
Basophils Relative: 1 %
Eosinophils Absolute: 0.1 10*3/uL (ref 0.0–0.5)
Eosinophils Relative: 2 %
HCT: 46.3 % — ABNORMAL HIGH (ref 36.0–46.0)
Hemoglobin: 15.8 g/dL — ABNORMAL HIGH (ref 12.0–15.0)
Immature Granulocytes: 0 %
Lymphocytes Relative: 18 %
Lymphs Abs: 1.1 10*3/uL (ref 0.7–4.0)
MCH: 29 pg (ref 26.0–34.0)
MCHC: 34.1 g/dL (ref 30.0–36.0)
MCV: 85.1 fL (ref 80.0–100.0)
Monocytes Absolute: 0.5 10*3/uL (ref 0.1–1.0)
Monocytes Relative: 9 %
Neutro Abs: 4.3 10*3/uL (ref 1.7–7.7)
Neutrophils Relative %: 70 %
Platelets: 231 10*3/uL (ref 150–400)
RBC: 5.44 MIL/uL — ABNORMAL HIGH (ref 3.87–5.11)
RDW: 11.5 % (ref 11.5–15.5)
WBC: 6.1 10*3/uL (ref 4.0–10.5)
nRBC: 0 % (ref 0.0–0.2)

## 2023-08-10 LAB — SURGICAL PCR SCREEN
MRSA, PCR: NEGATIVE
Staphylococcus aureus: POSITIVE — AB

## 2023-08-10 LAB — COMPREHENSIVE METABOLIC PANEL
ALT: 17 U/L (ref 0–44)
AST: 18 U/L (ref 15–41)
Albumin: 4.4 g/dL (ref 3.5–5.0)
Alkaline Phosphatase: 49 U/L (ref 38–126)
Anion gap: 9 (ref 5–15)
BUN: 12 mg/dL (ref 8–23)
CO2: 26 mmol/L (ref 22–32)
Calcium: 8.9 mg/dL (ref 8.9–10.3)
Chloride: 103 mmol/L (ref 98–111)
Creatinine, Ser: 0.61 mg/dL (ref 0.44–1.00)
GFR, Estimated: >60 mL/min (ref >60)
Glucose, Bld: 108 mg/dL — ABNORMAL HIGH (ref 70–99)
Potassium: 3.8 mmol/L (ref 3.5–5.1)
Sodium: 138 mmol/L (ref 135–145)
Total Bilirubin: 0.9 mg/dL (ref <1.2)
Total Protein: 7.4 g/dL (ref 6.5–8.1)

## 2023-08-10 NOTE — Patient Instructions (Addendum)
Your procedure is scheduled on:  Tuesday November 12 Report to the Registration Desk on the 1st floor of the [facility] To find out your arrival time, please call [phone] between 1PM - 3PM on:  Monday November 11 If your arrival time is 6:00 am, do not arrive before that time as the [facility] entrance doors do not open until 6:00 am.  REMEMBER: Instructions that are not followed completely may result in serious medical risk, up to and including death; or upon the discretion of your surgeon and anesthesiologist your surgery may need to be rescheduled.  Do not eat food after midnight the night before surgery.  No gum chewing or hard candies.  You may however, drink CLEAR liquids up to 2 hours before you are scheduled to arrive for your surgery. Do not drink anything within 2 hours of your scheduled arrival time.  Clear liquids include: - water  - apple juice without pulp - gatorade (not RED colors) - black coffee or tea (Do NOT add milk or creamers to the coffee or tea) Do NOT drink anything that is not on this list.  In addition, your doctor has ordered for you to drink the provided:  Ensure Pre-Surgery Clear Carbohydrate Drink  Drinking this carbohydrate drink up to two hours before surgery helps to reduce insulin resistance and improve patient outcomes. Please complete drinking 2 hours before scheduled arrival time.  One week prior to surgery:Tuesday November 5  Stop Anti-inflammatories (NSAIDS) such as Advil, Aleve, Ibuprofen, Motrin, Naproxen, Naprosyn and Aspirin based products such as Excedrin, Goody's Powder, BC Powder.  Stop ANY OVER THE COUNTER supplements until after surgery. VITAMIN D   You may however, continue to take Tylenol if needed for pain up until the day of surgery.  Continue taking all of your other prescription medications up until the day of surgery.  ON THE DAY OF SURGERY ONLY TAKE THESE MEDICATIONS WITH SIPS OF WATER:  citalopram (CELEXA)    No Alcohol for 24 hours before or after surgery.  No Smoking including e-cigarettes for 24 hours before surgery.  No chewable tobacco products for at least 6 hours before surgery.  No nicotine patches on the day of surgery.  Do not use any "recreational" drugs for at least a week (preferably 2 weeks) before your surgery.  Please be advised that the combination of cocaine and anesthesia may have negative outcomes, up to and including death. If you test positive for cocaine, your surgery will be cancelled.  On the morning of surgery brush your teeth with toothpaste and water, you may rinse your mouth with mouthwash if you wish. Do not swallow any toothpaste or mouthwash.  Use CHG Soap as directed on instruction sheet.  Do not wear jewelry, make-up, hairpins, clips or nail polish.  For welded (permanent) jewelry: bracelets, anklets, waist bands, etc.  Please have this removed prior to surgery.  If it is not removed, there is a chance that hospital personnel will need to cut it off on the day of surgery.  Do not wear lotions, powders, or perfumes.   Do not shave body hair from the neck down 48 hours before surgery.  Contact lenses, hearing aids and dentures may not be worn into surgery.  Do not bring valuables to the hospital. [facility] is not responsible for any missing/lost belongings or valuables.   Notify your doctor if there is any change in your medical condition (cold, fever, infection).  Wear comfortable clothing (specific to your surgery  type) to the hospital.  After surgery, you can help prevent lung complications by doing breathing exercises.   Take deep breaths and cough every 1-2 hours. Your doctor may order a device called an Incentive Spirometer to help you take deep breaths.  If you are being admitted to the hospital overnight, leave your suitcase in the car. After surgery it may be brought to your room.  In case of increased patient census, it may be necessary  for you, the patient, to continue your postoperative care in the Same Day Surgery department.  If you are being discharged the day of surgery, you will not be allowed to drive home. You will need a responsible individual to drive you home and stay with you for 24 hours after surgery.   If you are taking public transportation, you will need to have a responsible individual with you.  Please call the Pre-admissions Testing Dept. at [phone] if you have any questions about these instructions.  Surgery Visitation Policy:  Patients having surgery or a procedure may have two visitors.  Children under the age of 24 must have an adult with them who is not the patient.  Inpatient Visitation:    Visiting hours are 7 a.m. to 8 p.m. Up to four visitors are allowed at one time in a patient room. The visitors may rotate out with other people during the day.  One visitor age 56 or older may stay with the patient overnight and must be in the room by 8 p.m.           Pre-operative 5 CHG Bath Instructions   You can play a key role in reducing the risk of infection after surgery. Your skin needs to be as free of germs as possible. You can reduce the number of germs on your skin by washing with CHG (chlorhexidine gluconate) soap before surgery. CHG is an antiseptic soap that kills germs and continues to kill germs even after washing.   DO NOT use if you have an allergy to chlorhexidine/CHG or antibacterial soaps. If your skin becomes reddened or irritated, stop using the CHG and notify one of our RNs at [phone].   Please shower with the CHG soap starting 4 days before surgery using the following schedule:     Please keep in mind the following:  DO NOT shave, including legs and underarms, starting the day of your first shower.   You may shave your face at any point before/day of surgery.  Place clean sheets on your bed the day you start using CHG soap. Use a clean washcloth (not used  since being washed) for each shower. DO NOT sleep with pets once you start using the CHG.   CHG Shower Instructions:  If you choose to wash your hair and private area, wash first with your normal shampoo/soap.  After you use shampoo/soap, rinse your hair and body thoroughly to remove shampoo/soap residue.  Turn the water OFF and apply about 3 tablespoons (45 ml) of CHG soap to a CLEAN washcloth.  Apply CHG soap ONLY FROM YOUR NECK DOWN TO YOUR TOES (washing for 3-5 minutes)  DO NOT use CHG soap on face, private areas, open wounds, or sores.  Pay special attention to the area where your surgery is being performed.  If you are having back surgery, having someone wash your back for you may be helpful. Wait 2 minutes after CHG soap is applied, then you may rinse off the CHG soap.  Pat dry  with a clean towel  Put on clean clothes/pajamas   If you choose to wear lotion, please use ONLY the CHG-compatible lotions on the back of this paper.     Additional instructions for the day of surgery: DO NOT APPLY any lotions, deodorants, cologne, or perfumes.   Put on clean/comfortable clothes.  Brush your teeth.  Ask your nurse before applying any prescription medications to the skin.      CHG Compatible Lotions   Aveeno Moisturizing lotion  Cetaphil Moisturizing Cream  Cetaphil Moisturizing Lotion  Clairol Herbal Essence Moisturizing Lotion, Dry Skin  Clairol Herbal Essence Moisturizing Lotion, Extra Dry Skin  Clairol Herbal Essence Moisturizing Lotion, Normal Skin  Curel Age Defying Therapeutic Moisturizing Lotion with Alpha Hydroxy  Curel Extreme Care Body Lotion  Curel Soothing Hands Moisturizing Hand Lotion  Curel Therapeutic Moisturizing Cream, Fragrance-Free  Curel Therapeutic Moisturizing Lotion, Fragrance-Free  Curel Therapeutic Moisturizing Lotion, Original Formula  Eucerin Daily Replenishing Lotion  Eucerin Dry Skin Therapy Plus Alpha Hydroxy Crme  Eucerin Dry Skin Therapy Plus  Alpha Hydroxy Lotion  Eucerin Original Crme  Eucerin Original Lotion  Eucerin Plus Crme Eucerin Plus Lotion  Eucerin TriLipid Replenishing Lotion  Keri Anti-Bacterial Hand Lotion  Keri Deep Conditioning Original Lotion Dry Skin Formula Softly Scented  Keri Deep Conditioning Original Lotion, Fragrance Free Sensitive Skin Formula  Keri Lotion Fast Absorbing Fragrance Free Sensitive Skin Formula  Keri Lotion Fast Absorbing Softly Scented Dry Skin Formula  Keri Original Lotion  Keri Skin Renewal Lotion Keri Silky Smooth Lotion  Keri Silky Smooth Sensitive Skin Lotion  Nivea Body Creamy Conditioning Oil  Nivea Body Extra Enriched Lotion  Nivea Body Original Lotion  Nivea Body Sheer Moisturizing Lotion Nivea Crme  Nivea Skin Firming Lotion  NutraDerm 30 Skin Lotion  NutraDerm Skin Lotion  NutraDerm Therapeutic Skin Cream  NutraDerm Therapeutic Skin Lotion  ProShield Protective Hand Cream  Provon moisturizing lotion       How to Use an Incentive Spirometer  An incentive spirometer is a tool that measures how well you are filling your lungs with each breath. Learning to take long, deep breaths using this tool can help you keep your lungs clear and active. This may help to reverse or lessen your chance of developing breathing (pulmonary) problems, especially infection. You may be asked to use a spirometer: After a surgery. If you have a lung problem or a history of smoking. After a long period of time when you have been unable to move or be active. If the spirometer includes an indicator to show the highest number that you have reached, your health care provider or respiratory therapist will help you set a goal. Keep a log of your progress as told by your health care provider. What are the risks? Breathing too quickly may cause dizziness or cause you to pass out. Take your time so you do not get dizzy or light-headed. If you are in pain, you may need to take pain medicine before  doing incentive spirometry. It is harder to take a deep breath if you are having pain. How to use your incentive spirometer  Sit up on the edge of your bed or on a chair. Hold the incentive spirometer so that it is in an upright position. Before you use the spirometer, breathe out normally. Place the mouthpiece in your mouth. Make sure your lips are closed tightly around it. Breathe in slowly and as deeply as you can through your mouth, causing the piston or the  ball to rise toward the top of the chamber. Hold your breath for 3-5 seconds, or for as long as possible. If the spirometer includes a coach indicator, use this to guide you in breathing. Slow down your breathing if the indicator goes above the marked areas. Remove the mouthpiece from your mouth and breathe out normally. The piston or ball will return to the bottom of the chamber. Rest for a few seconds, then repeat the steps 10 or more times. Take your time and take a few normal breaths between deep breaths so that you do not get dizzy or light-headed. Do this every 1-2 hours when you are awake. If the spirometer includes a goal marker to show the highest number you have reached (best effort), use this as a goal to work toward during each repetition. After each set of 10 deep breaths, cough a few times. This will help to make sure that your lungs are clear. If you have an incision on your chest or abdomen from surgery, place a pillow or a rolled-up towel firmly against the incision when you cough. This can help to reduce pain while taking deep breaths and coughing. General tips When you are able to get out of bed: Walk around often. Continue to take deep breaths and cough in order to clear your lungs. Keep using the incentive spirometer until your health care provider says it is okay to stop using it. If you have been in the hospital, you may be told to keep using the spirometer at home. Contact a health care provider if: You are  having difficulty using the spirometer. You have trouble using the spirometer as often as instructed. Your pain medicine is not giving enough relief for you to use the spirometer as told. You have a fever. Get help right away if: You develop shortness of breath. You develop a cough with bloody mucus from the lungs. You have fluid or blood coming from an incision site after you cough. Summary An incentive spirometer is a tool that can help you learn to take long, deep breaths to keep your lungs clear and active. You may be asked to use a spirometer after a surgery, if you have a lung problem or a history of smoking, or if you have been inactive for a long period of time. Use your incentive spirometer as instructed every 1-2 hours while you are awake. If you have an incision on your chest or abdomen, place a pillow or a rolled-up towel firmly against your incision when you cough. This will help to reduce pain. Get help right away if you have shortness of breath, you cough up bloody mucus, or blood comes from your incision when you cough. This information is not intended to replace advice given to you by your health care provider. Make sure you discuss any questions you have with your health care provider. Document Revised: 12/10/2019 Document Reviewed: 12/10/2019 Elsevier Patient Education  2023 [employer]                  Preoperative Educational Videos for Total Hip, Knee and Shoulder Replacements  To better prepare for surgery, please view our videos that explain the physical activity and discharge planning required to have the best surgical recovery at [facility].  [email or web address]  Questions? Call [phone] or email [email or web address]

## 2023-08-16 ENCOUNTER — Ambulatory Visit
Admission: RE | Admit: 2023-08-16 | Discharge: 2023-08-17 | Disposition: A | Discharge status: Home / Self Care | Payer: BC Managed Care – PPO | Attending: Surgery | Admitting: Surgery

## 2023-08-16 ENCOUNTER — Encounter
Admission: RE | Disposition: A | Discharge status: Home / Self Care | Payer: Self-pay | Source: Home / Self Care | Attending: Surgery

## 2023-08-16 ENCOUNTER — Ambulatory Visit: Payer: BC Managed Care – PPO

## 2023-08-16 ENCOUNTER — Ambulatory Visit: Payer: BC Managed Care – PPO | Admitting: Urgent Care

## 2023-08-16 ENCOUNTER — Other Ambulatory Visit: Payer: Self-pay

## 2023-08-16 ENCOUNTER — Ambulatory Visit: Payer: BC Managed Care – PPO | Admitting: Certified Registered"

## 2023-08-16 ENCOUNTER — Encounter: Payer: Self-pay | Admitting: Surgery

## 2023-08-16 DIAGNOSIS — Z7901 Long term (current) use of anticoagulants: Secondary | ICD-10-CM | POA: Insufficient documentation

## 2023-08-16 DIAGNOSIS — E669 Obesity, unspecified: Secondary | ICD-10-CM | POA: Insufficient documentation

## 2023-08-16 DIAGNOSIS — Z6839 Body mass index (BMI) 39.0-39.9, adult: Secondary | ICD-10-CM | POA: Diagnosis not present

## 2023-08-16 DIAGNOSIS — Z87891 Personal history of nicotine dependence: Secondary | ICD-10-CM | POA: Insufficient documentation

## 2023-08-16 DIAGNOSIS — I10 Essential (primary) hypertension: Secondary | ICD-10-CM | POA: Diagnosis not present

## 2023-08-16 DIAGNOSIS — F32A Depression, unspecified: Secondary | ICD-10-CM | POA: Insufficient documentation

## 2023-08-16 DIAGNOSIS — M1711 Unilateral primary osteoarthritis, right knee: Secondary | ICD-10-CM | POA: Insufficient documentation

## 2023-08-16 DIAGNOSIS — Z79899 Other long term (current) drug therapy: Secondary | ICD-10-CM | POA: Insufficient documentation

## 2023-08-16 DIAGNOSIS — Z96651 Presence of right artificial knee joint: Secondary | ICD-10-CM

## 2023-08-16 DIAGNOSIS — E041 Nontoxic single thyroid nodule: Secondary | ICD-10-CM | POA: Diagnosis not present

## 2023-08-16 DIAGNOSIS — Z791 Long term (current) use of non-steroidal anti-inflammatories (NSAID): Secondary | ICD-10-CM | POA: Diagnosis not present

## 2023-08-16 DIAGNOSIS — E785 Hyperlipidemia, unspecified: Secondary | ICD-10-CM | POA: Diagnosis not present

## 2023-08-16 DIAGNOSIS — G4730 Sleep apnea, unspecified: Secondary | ICD-10-CM | POA: Insufficient documentation

## 2023-08-16 DIAGNOSIS — K219 Gastro-esophageal reflux disease without esophagitis: Secondary | ICD-10-CM | POA: Diagnosis not present

## 2023-08-16 HISTORY — PX: TOTAL KNEE ARTHROPLASTY: SHX125

## 2023-08-16 LAB — GLUCOSE, CAPILLARY: Glucose-Capillary: 179 mg/dL — ABNORMAL HIGH (ref 70–99)

## 2023-08-16 SURGERY — ARTHROPLASTY, KNEE, TOTAL
Anesthesia: Spinal | Site: Knee | Laterality: Right

## 2023-08-16 MED ORDER — CEFAZOLIN SODIUM-DEXTROSE 2-4 GM/100ML-% IV SOLN
2.0000 g | INTRAVENOUS | Status: AC
  Administered 2023-08-16: 2 g via INTRAVENOUS

## 2023-08-16 MED ORDER — KETOROLAC TROMETHAMINE 15 MG/ML IJ SOLN
7.5000 mg | Freq: Four times a day (QID) | INTRAMUSCULAR | Status: DC
  Administered 2023-08-16 – 2023-08-17 (×3): 7.5 mg via INTRAVENOUS
  Filled 2023-08-16 (×3): qty 1

## 2023-08-16 MED ORDER — ACETAMINOPHEN 500 MG PO TABS
1000.0000 mg | ORAL_TABLET | Freq: Four times a day (QID) | ORAL | Status: DC
  Administered 2023-08-16 – 2023-08-17 (×3): 1000 mg via ORAL
  Filled 2023-08-16 (×3): qty 2

## 2023-08-16 MED ORDER — TRANEXAMIC ACID-NACL 1000-0.7 MG/100ML-% IV SOLN
INTRAVENOUS | Status: AC
  Filled 2023-08-16: qty 100

## 2023-08-16 MED ORDER — ONDANSETRON HCL 4 MG/2ML IJ SOLN: 4.0000 mg | Freq: Once | INTRAMUSCULAR | Status: DC | PRN

## 2023-08-16 MED ORDER — PROPOFOL 1000 MG/100ML IV EMUL
INTRAVENOUS | Status: AC
  Filled 2023-08-16: qty 100

## 2023-08-16 MED ORDER — DEXAMETHASONE SODIUM PHOSPHATE 10 MG/ML IJ SOLN
INTRAMUSCULAR | Status: AC
  Filled 2023-08-16: qty 1

## 2023-08-16 MED ORDER — SODIUM CHLORIDE (PF) 0.9 % IJ SOLN
INTRAMUSCULAR | Status: AC
  Filled 2023-08-16: qty 50

## 2023-08-16 MED ORDER — METOCLOPRAMIDE HCL 5 MG/ML IJ SOLN: 5.0000 mg | Freq: Three times a day (TID) | INTRAMUSCULAR | Status: DC | PRN

## 2023-08-16 MED ORDER — PROPOFOL 10 MG/ML IV BOLUS
INTRAVENOUS | Status: DC | PRN
  Administered 2023-08-16: 30 mg via INTRAVENOUS
  Administered 2023-08-16: 90 ug/kg/min via INTRAVENOUS

## 2023-08-16 MED ORDER — ACETAMINOPHEN 10 MG/ML IV SOLN
INTRAVENOUS | Status: AC
  Filled 2023-08-16: qty 100

## 2023-08-16 MED ORDER — KETOROLAC TROMETHAMINE 15 MG/ML IJ SOLN
INTRAMUSCULAR | Status: AC
  Filled 2023-08-16: qty 1

## 2023-08-16 MED ORDER — SODIUM CHLORIDE FLUSH 0.9 % IV SOLN
INTRAVENOUS | Status: AC
  Filled 2023-08-16: qty 40

## 2023-08-16 MED ORDER — FLEET ENEMA RE ENEM: 1.0000 | ENEMA | Freq: Once | RECTAL | Status: DC | PRN

## 2023-08-16 MED ORDER — DEXAMETHASONE SODIUM PHOSPHATE 10 MG/ML IJ SOLN
INTRAMUSCULAR | Status: DC | PRN
  Administered 2023-08-16: 10 mg via INTRAVENOUS

## 2023-08-16 MED ORDER — CEFAZOLIN SODIUM-DEXTROSE 2-4 GM/100ML-% IV SOLN
INTRAVENOUS | Status: AC
  Filled 2023-08-16: qty 100

## 2023-08-16 MED ORDER — ONDANSETRON HCL 4 MG/2ML IJ SOLN: 4.0000 mg | Freq: Four times a day (QID) | INTRAMUSCULAR | Status: DC | PRN

## 2023-08-16 MED ORDER — BISACODYL 10 MG RE SUPP: 10.0000 mg | Freq: Every day | RECTAL | Status: DC | PRN

## 2023-08-16 MED ORDER — TRIAMCINOLONE ACETONIDE 40 MG/ML IJ SUSP
INTRAMUSCULAR | Status: AC
  Filled 2023-08-16: qty 1

## 2023-08-16 MED ORDER — APIXABAN 2.5 MG PO TABS: 2.5000 mg | ORAL_TABLET | Freq: Two times a day (BID) | ORAL | 0 refills | Status: AC

## 2023-08-16 MED ORDER — OXYCODONE HCL 5 MG PO TABS: 5.0000 mg | ORAL_TABLET | ORAL | Status: DC | PRN

## 2023-08-16 MED ORDER — KETOROLAC TROMETHAMINE 15 MG/ML IJ SOLN
15.0000 mg | Freq: Once | INTRAMUSCULAR | Status: AC
  Administered 2023-08-16: 15 mg via INTRAVENOUS

## 2023-08-16 MED ORDER — SODIUM CHLORIDE 0.9 % IR SOLN
Status: DC | PRN
  Administered 2023-08-16: 3000 mL

## 2023-08-16 MED ORDER — ONDANSETRON 4 MG PO TBDP: 4.0000 mg | ORAL_TABLET | Freq: Three times a day (TID) | ORAL | 1 refills | Status: AC | PRN

## 2023-08-16 MED ORDER — LACTATED RINGERS IV SOLN: INTRAVENOUS | Status: DC

## 2023-08-16 MED ORDER — OXYCODONE HCL 5 MG/5ML PO SOLN: 5.0000 mg | Freq: Once | ORAL | Status: DC | PRN

## 2023-08-16 MED ORDER — BUPIVACAINE-EPINEPHRINE (PF) 0.5% -1:200000 IJ SOLN
INTRAMUSCULAR | Status: AC
  Filled 2023-08-16: qty 30

## 2023-08-16 MED ORDER — SODIUM CHLORIDE 0.9 % IV SOLN
INTRAVENOUS | Status: DC | PRN
  Administered 2023-08-16: 60 mL

## 2023-08-16 MED ORDER — BUPIVACAINE LIPOSOME 1.3 % IJ SUSP
INTRAMUSCULAR | Status: AC
  Filled 2023-08-16: qty 20

## 2023-08-16 MED ORDER — CITALOPRAM HYDROBROMIDE 10 MG PO TABS
20.0000 mg | ORAL_TABLET | Freq: Every day | ORAL | Status: DC
  Administered 2023-08-17: 20 mg via ORAL
  Filled 2023-08-16: qty 2

## 2023-08-16 MED ORDER — BUPIVACAINE HCL (PF) 0.5 % IJ SOLN
INTRAMUSCULAR | Status: DC | PRN
  Administered 2023-08-16: 2.5 mL via INTRATHECAL

## 2023-08-16 MED ORDER — INFLUENZA VIRUS VACC SPLIT PF (FLUZONE) 0.5 ML IM SUSY: 0.5000 mL | PREFILLED_SYRINGE | INTRAMUSCULAR | Status: DC

## 2023-08-16 MED ORDER — APIXABAN 2.5 MG PO TABS
2.5000 mg | ORAL_TABLET | Freq: Two times a day (BID) | ORAL | Status: DC
  Administered 2023-08-17: 2.5 mg via ORAL
  Filled 2023-08-16: qty 1

## 2023-08-16 MED ORDER — ONDANSETRON HCL 4 MG/2ML IJ SOLN
INTRAMUSCULAR | Status: AC
  Filled 2023-08-16: qty 2

## 2023-08-16 MED ORDER — PROPOFOL 10 MG/ML IV BOLUS
INTRAVENOUS | Status: AC
  Filled 2023-08-16: qty 20

## 2023-08-16 MED ORDER — OXYCODONE HCL 5 MG PO TABS: 5.0000 mg | ORAL_TABLET | ORAL | 0 refills | Status: AC | PRN

## 2023-08-16 MED ORDER — BUPIVACAINE-EPINEPHRINE (PF) 0.5% -1:200000 IJ SOLN
INTRAMUSCULAR | Status: DC | PRN
  Administered 2023-08-16: 30 mL

## 2023-08-16 MED ORDER — SODIUM CHLORIDE 0.9 % BOLUS PEDS
250.0000 mL | Freq: Once | INTRAVENOUS | Status: AC
  Administered 2023-08-16: 250 mL via INTRAVENOUS

## 2023-08-16 MED ORDER — FENTANYL CITRATE (PF) 100 MCG/2ML IJ SOLN: 25.0000 ug | INTRAMUSCULAR | Status: DC | PRN

## 2023-08-16 MED ORDER — CEFAZOLIN SODIUM-DEXTROSE 2-4 GM/100ML-% IV SOLN
2.0000 g | Freq: Four times a day (QID) | INTRAVENOUS | Status: AC
Start: 2023-08-16 — End: 2023-08-17
  Administered 2023-08-16 – 2023-08-17 (×3): 2 g via INTRAVENOUS
  Filled 2023-08-16 (×3): qty 100

## 2023-08-16 MED ORDER — LIDOCAINE HCL (PF) 2 % IJ SOLN
INTRAMUSCULAR | Status: AC
  Filled 2023-08-16: qty 5

## 2023-08-16 MED ORDER — OXYCODONE HCL 5 MG PO TABS: 5.0000 mg | ORAL_TABLET | Freq: Once | ORAL | Status: DC | PRN

## 2023-08-16 MED ORDER — MIDAZOLAM HCL 5 MG/5ML IJ SOLN
INTRAMUSCULAR | Status: DC | PRN
  Administered 2023-08-16: 2 mg via INTRAVENOUS

## 2023-08-16 MED ORDER — DOCUSATE SODIUM 100 MG PO CAPS
100.0000 mg | ORAL_CAPSULE | Freq: Two times a day (BID) | ORAL | Status: DC
  Administered 2023-08-16 – 2023-08-17 (×2): 100 mg via ORAL
  Filled 2023-08-16 (×2): qty 1

## 2023-08-16 MED ORDER — ACETAMINOPHEN 10 MG/ML IV SOLN
INTRAVENOUS | Status: DC | PRN
  Administered 2023-08-16: 1000 mg via INTRAVENOUS

## 2023-08-16 MED ORDER — LIDOCAINE HCL (CARDIAC) PF 100 MG/5ML IV SOSY
PREFILLED_SYRINGE | INTRAVENOUS | Status: DC | PRN
  Administered 2023-08-16: 40 mg via INTRAVENOUS

## 2023-08-16 MED ORDER — ONDANSETRON HCL 4 MG PO TABS: 4.0000 mg | ORAL_TABLET | Freq: Four times a day (QID) | ORAL | Status: DC | PRN

## 2023-08-16 MED ORDER — SIMVASTATIN 20 MG PO TABS
20.0000 mg | ORAL_TABLET | Freq: Every day | ORAL | Status: DC
Start: 2023-08-16 — End: 2023-08-17
  Administered 2023-08-16: 20 mg via ORAL
  Filled 2023-08-16: qty 1

## 2023-08-16 MED ORDER — ACETAMINOPHEN 10 MG/ML IV SOLN
1000.0000 mg | Freq: Once | INTRAVENOUS | Status: DC | PRN
Start: 2023-08-16 — End: 2023-08-16

## 2023-08-16 MED ORDER — KETAMINE HCL 50 MG/5ML IJ SOSY
PREFILLED_SYRINGE | INTRAMUSCULAR | Status: DC | PRN
  Administered 2023-08-16: 20 mg via INTRAVENOUS

## 2023-08-16 MED ORDER — ONDANSETRON HCL 4 MG/2ML IJ SOLN
INTRAMUSCULAR | Status: DC | PRN
  Administered 2023-08-16: 4 mg via INTRAVENOUS

## 2023-08-16 MED ORDER — TRIAMCINOLONE ACETONIDE 40 MG/ML IJ SUSP
INTRAMUSCULAR | Status: DC | PRN
  Administered 2023-08-16: 80 mg

## 2023-08-16 MED ORDER — CHLORHEXIDINE GLUCONATE 0.12 % MT SOLN
OROMUCOSAL | Status: AC
  Filled 2023-08-16: qty 15

## 2023-08-16 MED ORDER — MIDAZOLAM HCL 2 MG/2ML IJ SOLN
INTRAMUSCULAR | Status: AC
  Filled 2023-08-16: qty 2

## 2023-08-16 MED ORDER — EPHEDRINE 5 MG/ML INJ
INTRAVENOUS | Status: AC
  Filled 2023-08-16: qty 5

## 2023-08-16 MED ORDER — METOCLOPRAMIDE HCL 5 MG PO TABS: 5.0000 mg | ORAL_TABLET | Freq: Three times a day (TID) | ORAL | Status: DC | PRN

## 2023-08-16 MED ORDER — 0.9 % SODIUM CHLORIDE (POUR BTL) OPTIME
TOPICAL | Status: DC | PRN
  Administered 2023-08-16: 1000 mL

## 2023-08-16 MED ORDER — PHENYLEPHRINE HCL-NACL 20-0.9 MG/250ML-% IV SOLN
INTRAVENOUS | Status: DC | PRN
  Administered 2023-08-16: 20 ug/min via INTRAVENOUS

## 2023-08-16 MED ORDER — PHENYLEPHRINE HCL-NACL 20-0.9 MG/250ML-% IV SOLN
INTRAVENOUS | Status: AC
  Filled 2023-08-16: qty 250

## 2023-08-16 MED ORDER — MAGNESIUM HYDROXIDE 400 MG/5ML PO SUSP: 30.0000 mL | Freq: Every day | ORAL | Status: DC | PRN

## 2023-08-16 MED ORDER — ORAL CARE MOUTH RINSE: 15.0000 mL | Freq: Once | OROMUCOSAL | Status: AC

## 2023-08-16 MED ORDER — KETOROLAC TROMETHAMINE 30 MG/ML IJ SOLN
INTRAMUSCULAR | Status: DC | PRN
  Administered 2023-08-16: 30 mg via INTRAMUSCULAR

## 2023-08-16 MED ORDER — DIPHENHYDRAMINE HCL 12.5 MG/5ML PO ELIX: 12.5000 mg | ORAL_SOLUTION | ORAL | Status: DC | PRN

## 2023-08-16 MED ORDER — KETOROLAC TROMETHAMINE 30 MG/ML IJ SOLN
INTRAMUSCULAR | Status: AC
  Filled 2023-08-16: qty 1

## 2023-08-16 MED ORDER — EPHEDRINE SULFATE-NACL 50-0.9 MG/10ML-% IV SOSY
PREFILLED_SYRINGE | INTRAVENOUS | Status: DC | PRN
  Administered 2023-08-16: 10 mg via INTRAVENOUS
  Administered 2023-08-16: 5 mg via INTRAVENOUS

## 2023-08-16 MED ORDER — TRANEXAMIC ACID-NACL 1000-0.7 MG/100ML-% IV SOLN
1000.0000 mg | INTRAVENOUS | Status: AC
  Administered 2023-08-16: 1000 mg via INTRAVENOUS

## 2023-08-16 MED ORDER — CHLORHEXIDINE GLUCONATE 0.12 % MT SOLN
15.0000 mL | Freq: Once | OROMUCOSAL | Status: AC
  Administered 2023-08-16: 15 mL via OROMUCOSAL

## 2023-08-16 MED ORDER — ACETAMINOPHEN 325 MG PO TABS: 325.0000 mg | ORAL_TABLET | Freq: Four times a day (QID) | ORAL | Status: DC | PRN

## 2023-08-16 MED ORDER — KETAMINE HCL 50 MG/5ML IJ SOSY
PREFILLED_SYRINGE | INTRAMUSCULAR | Status: AC
  Filled 2023-08-16: qty 5

## 2023-08-16 SURGICAL SUPPLY — 66 items
APL PRP STRL LF DISP 70% ISPRP (MISCELLANEOUS) ×2
BIT DRILL QUICK REL 1/8 2PK SL (BIT) IMPLANT
BLADE SAW 90X13X1.19 OSCILLAT (BLADE) ×1 IMPLANT
BLADE SAW SAG 25X90X1.19 (BLADE) ×1 IMPLANT
BLADE SURG SZ20 CARB STEEL (BLADE) ×1 IMPLANT
BNDG CMPR STD VLCR NS LF 5.8X6 (GAUZE/BANDAGES/DRESSINGS) ×1
BNDG ELASTIC 6X5.8 VLCR NS LF (GAUZE/BANDAGES/DRESSINGS) ×1 IMPLANT
CEMENT BONE R 1X40 (Cement) ×2 IMPLANT
CEMENT VACUUM MIXING SYSTEM (MISCELLANEOUS) ×1 IMPLANT
CHLORAPREP W/TINT 26 (MISCELLANEOUS) ×1 IMPLANT
COMP FEM CMT CR PERS SZ8 RT (Joint) ×1 IMPLANT
COMP PATELLAR PEGX3 28X6.2 (Knees) ×1 IMPLANT
COMP TIB CMT PS D 0D RT (Joint) ×1 IMPLANT
COMPONENT FEM CMT CR PRS SZ8RT (Joint) IMPLANT
COMPONENT PTLLAR PEGX3 28X6.2 (Knees) IMPLANT
COMPONENT TIB CMT PS D 0D RT (Joint) IMPLANT
COOLER POLAR GLACIER W/PUMP (MISCELLANEOUS) ×1 IMPLANT
COVER MAYO STAND STRL (DRAPES) ×1 IMPLANT
CUFF TOURN SGL QUICK 24 (TOURNIQUET CUFF)
CUFF TOURN SGL QUICK 34 (TOURNIQUET CUFF) ×1
CUFF TRNQT CYL 24X4X16.5-23 (TOURNIQUET CUFF) IMPLANT
CUFF TRNQT CYL 34X4.125X (TOURNIQUET CUFF) IMPLANT
DRAPE IMP U-DRAPE 54X76 (DRAPES) ×1 IMPLANT
DRAPE SHEET LG 3/4 BI-LAMINATE (DRAPES) ×1 IMPLANT
DRAPE U-SHAPE 47X51 STRL (DRAPES) ×1 IMPLANT
DRSG MEPILEX SACRM 8.7X9.8 (GAUZE/BANDAGES/DRESSINGS) IMPLANT
DRSG OPSITE POSTOP 4X10 (GAUZE/BANDAGES/DRESSINGS) ×1 IMPLANT
DRSG OPSITE POSTOP 4X8 (GAUZE/BANDAGES/DRESSINGS) ×1 IMPLANT
ELECT CAUTERY BLADE 6.4 (BLADE) ×1 IMPLANT
ELECT REM PT RETURN 9FT ADLT (ELECTROSURGICAL) ×1
ELECTRODE REM PT RTRN 9FT ADLT (ELECTROSURGICAL) ×1 IMPLANT
GAUZE XEROFORM 1X8 LF (GAUZE/BANDAGES/DRESSINGS) ×1 IMPLANT
GLOVE BIO SURGEON STRL SZ7.5 (GLOVE) ×4 IMPLANT
GLOVE BIO SURGEON STRL SZ8 (GLOVE) ×4 IMPLANT
GLOVE BIOGEL PI IND STRL 8 (GLOVE) ×1 IMPLANT
GLOVE INDICATOR 8.0 STRL GRN (GLOVE) ×1 IMPLANT
GOWN STRL REUS W/ TWL LRG LVL3 (GOWN DISPOSABLE) ×1 IMPLANT
GOWN STRL REUS W/ TWL XL LVL3 (GOWN DISPOSABLE) ×1 IMPLANT
GOWN STRL REUS W/TWL LRG LVL3 (GOWN DISPOSABLE) ×2
GOWN STRL REUS W/TWL XL LVL3 (GOWN DISPOSABLE) ×2
HANDLE YANKAUER SUCT OPEN TIP (MISCELLANEOUS) ×1 IMPLANT
HOOD PEEL AWAY T7 (MISCELLANEOUS) ×3 IMPLANT
INSERT TIB PERS SZ 8-9 10 RT (Insert) IMPLANT
IV NS IRRIG 3000ML ARTHROMATIC (IV SOLUTION) ×1 IMPLANT
KIT TURNOVER KIT A (KITS) ×1 IMPLANT
MANIFOLD NEPTUNE II (INSTRUMENTS) ×1 IMPLANT
NDL SPNL 20GX3.5 QUINCKE YW (NEEDLE) ×1 IMPLANT
NEEDLE SPNL 20GX3.5 QUINCKE YW (NEEDLE) ×1
NS IRRIG 500ML POUR BTL (IV SOLUTION) ×1 IMPLANT
PACK TOTAL KNEE (MISCELLANEOUS) ×1 IMPLANT
PAD WRAPON POLAR KNEE (MISCELLANEOUS) ×1 IMPLANT
PENCIL SMOKE EVACUATOR (MISCELLANEOUS) ×1 IMPLANT
PULSAVAC PLUS IRRIG FAN TIP (DISPOSABLE) ×1
SCREW FEMALE HEX FIX 25X2.5 (ORTHOPEDIC DISPOSABLE SUPPLIES) IMPLANT
STAPLER SKIN PROX 35W (STAPLE) ×1 IMPLANT
SUCTION TUBE FRAZIER 10FR DISP (SUCTIONS) ×1 IMPLANT
SUT VIC AB 0 CT1 36 (SUTURE) ×3 IMPLANT
SUT VIC AB 2-0 CT1 27 (SUTURE) ×4
SUT VIC AB 2-0 CT1 TAPERPNT 27 (SUTURE) ×3 IMPLANT
SYR 10ML LL (SYRINGE) ×1 IMPLANT
SYR 20ML LL LF (SYRINGE) ×1 IMPLANT
SYR 30ML LL (SYRINGE) IMPLANT
TIP FAN IRRIG PULSAVAC PLUS (DISPOSABLE) ×1 IMPLANT
TRAP FLUID SMOKE EVACUATOR (MISCELLANEOUS) ×1 IMPLANT
WATER STERILE IRR 500ML POUR (IV SOLUTION) ×1 IMPLANT
WRAPON POLAR PAD KNEE (MISCELLANEOUS) ×1

## 2023-08-16 NOTE — Op Note (Signed)
08/16/2023  12:55 PM  Patient:   Isabella Luna  Pre-Op Diagnosis:   Degenerative joint disease, right knee.  Post-Op Diagnosis:   Same  Procedure:   Right TKA using all-cemented Zimmer Persona system with a #8 narrow PCR femur, a(n) D-sized  tibial tray with a 10 mm medial congruent E-poly insert, and a 28 x 6.2 mm Vanguard all-poly 3-pegged domed patella.  Surgeon:   Maryagnes Amos, MD  Assistant:   Horris Latino, PA-C; Ahmed Prima, PA-S  Anesthesia:   Spinal  Findings:   As above  Complications:   None  EBL:   5 cc  Fluids:   300 cc crystalloid  UOP:   None  TT:   85 minutes at 300 mmHg  Drains:   None  Closure:   Staples  Implants:   As above  Brief Clinical Note:   The patient is a 62 year old female with a long history of progressively worsening right knee pain. The patient's symptoms have progressed despite medications, activity modification, injections, etc. The patient's history and examination were consistent with degenerative joint disease of the right knee confirmed by plain radiographs and preoperative MRI imaging. The patient presents at this time for a right total knee arthroplasty.  Procedure:   The patient was brought into the operating room. After adequate spinal anesthesia was obtained, the patient was repositioned in the supine position on the operating room table. The right lower extremity was prepped with ChloraPrep solution and draped sterilely. Preoperative antibiotics were administered. A timeout was performed to verify the appropriate surgical site before the limb was exsanguinated with an Esmarch and the tourniquet inflated to 300 mmHg.   A standard anterior approach to the knee was made through an approximately 6-7 inch incision. The incision was carried down through the subcutaneous tissues to expose superficial retinaculum. This was split the length of the incision and the medial flap elevated sufficiently to expose the medial retinaculum. The  medial retinaculum was incised, leaving a 3-4 mm cuff of tissue on the patella. This was extended distally along the medial border of the patellar tendon and proximally through the medial third of the quadriceps tendon. A subtotal fat pad excision was performed before the soft tissues were elevated off the anteromedial and anterolateral aspects of the proximal tibia to the level of the collateral ligaments. The anterior portions of the medial and lateral menisci were removed, as was the anterior cruciate ligament. With the knee flexed to 90, the external tibial guide was positioned and the appropriate proximal tibial cut made. This piece was taken to the back table where it was measured and found to be optimally replicated by a(n) D-sized component.  Attention was directed to the distal femur. The intramedullary canal was accessed through a 3/8" drill hole. The intramedullary guide was inserted and positioned in order to obtain a neutral flexion gap. The distal cutting block was placed at 5 of valgus alignment. Using the +0 slot, the distal cut was made. The distal femur was measured and found to be optimally replicated by the #8 component. The #8 4-in-1 cutting block was positioned and first the posterior, then the posterior chamfer, the anterior chamfer, and finally the anterior cuts were made after verifying that the anterior cortex would not be notched.   At this point, the posterior portions medial and lateral menisci were removed. A trial reduction was performed using the appropriate femoral and tibial components with the 10 mm insert. This demonstrated excellent stability to varus  and valgus stressing both in flexion and extension while permitting full extension. Patellar tracking was assessed and found to be excellent. Therefore, the tibial trial position was marked on the proximal tibia. The patella thickness was measured and found to be 17 mm. Therefore, it was felt best to use the thin Vanguard  patella rather than the thicker persona patella component. The appropriate cut was made. The patellar surface was measured and found to be optimally replicated by the  28  mm Vanguard component. The three peg holes were drilled in place before the trial button was inserted. Patella tracking was assessed and found to be excellent, passing the "no thumb test". The lug holes were drilled into the distal femur before the trial component was removed.  The tibial tray was repositioned before the keel was created using the appropriate tower, reamer, and punch.  The bony surfaces were prepared for cementing by irrigating them thoroughly with sterile saline solution via the jet lavage system. A bone plug was fashioned from some of the bone that had been removed previously and used to plug the distal femoral canal. In addition, a "cocktail" of 20 cc of Exparel, 30 cc of 0.5% Sensorcaine, 2 cc of Kenalog 40 (80 mg), and 30 mg of Toradol diluted out to 90 cc with normal saline was injected into the postero-medial and postero-lateral aspects of the knee, the medial and lateral gutter regions, and the peri-incisional tissues to help with postoperative analgesia. Meanwhile, the cement was being mixed on the back table.   When the cement was ready, the tibial tray was cemented in first. The excess cement was removed using Personal assistant. Next, the femoral component was impacted into place. Again, the excess cement was removed using Personal assistant. The 10 mm trial insert was positioned and the knee brought into extension while the cement hardened. Finally, the patella was cemented into place and secured using the patellar clamp. Again, the excess cement was removed using Personal assistant. Once the cement had hardened, the knee was placed through a range of motion with the findings as described above. Therefore, the trial insert was removed and, after verifying that no cement had been retained posteriorly, the permanent 10 mm  medial congruent E-polyethylene insert was snapped into place with care taken to ensure appropriate locking of the insert. Again the knee was placed through a range of motion with the findings as described above.  The wound was copiously irrigated with sterile saline solution using the jet lavage system before the quadriceps tendon and retinacular layer were reapproximated using #0 Vicryl interrupted sutures. The superficial retinacular layer also was closed using a running #0 Vicryl suture. The subcutaneous tissues were closed in several layers using 2-0 Vicryl interrupted sutures. The skin was closed using staples. A sterile honeycomb dressing was applied to the skin before the leg was wrapped with an Ace wrap to accommodate the Polar Care device. The patient was then awakened and returned to the recovery room in satisfactory condition after tolerating the procedure well.

## 2023-08-16 NOTE — H&P (Signed)
History of Present Illness: Isabella Luna is a 62 y.o. female who presents today for her surgical history and physical for upcoming right total knee arthroplasty scheduled with Dr. Joice Lofts on 08/16/2023. The patient reports a 3 out of 10 pain score. She denies any recent falls or trauma affecting the right knee. The patient denies any changes to her medical history since her last evaluation. She denies any personal history of heart attack, stroke, asthma or COPD. She denies any history of blood clots. She is a prediabetic, most recent A1c was 6.1.  Past Medical History: Depression  GERD (gastroesophageal reflux disease)  History of cancer 2017  Skin cancer  Hyperlipidemia  Primary osteoarthritis of left knee  Sleep apnea 2020  Currently on CPAP  Thyroid nodules  s/p biopsy of dominant rt nodule 05/2021 -- benign   Past Surgical History: FRACTURE SURGERY 10/1976  COLONOSCOPY 10/25/2011 [facility] (Mother): CBF 10/2016)  COLONOSCOPY 01/04/2017 [facility] (Mother): CBF 01/2022)  KNEE ARTHROSCOPY 05/04/2017 (Arthroscopic medial meniscus repair and arthrscopic partial lateral meniscectomy, left knee Left 05/04/2017 (Dr. Joice Lofts))  Left TKA using all cemented biomet vanguard system with a 65 mm PCR femur and a 67 mm tibial tray with a 10 mm AS E-Poly insert Left 03/07/2018 (Dr. Joice Lofts)  Colon @ [facility] 10/25/2022 (Hyperplastic polyps/FHx CC/Repeat 16yrs/SMR)  bladder sling  bunions Bilateral  EGD 0403/2018 (Gastritis, Esophagitis: No repeat per RTE)  HYSTERECTOMY   Past Family History: Hyperlipidemia (Elevated cholesterol) Mother  COPD Mother  Barrett's esophagus Mother  Colon cancer Mother  Colon polyps Mother  Coronary Artery Disease (Blocked arteries around heart) Father  BYPASS SURGERY MID 28s  Hyperlipidemia (Elevated cholesterol) Father  Colon polyps Father  Coronary Artery Disease (Blocked arteries around heart) Paternal Grandmother  BYPASS SURGERY LATE 50s  Melanoma Brother  Alcohol abuse  Paternal Grandfather  Colon cancer Maternal Grandmother   Medications: benzonatate (TESSALON) 100 MG capsule Take 100 mg by mouth 3 (three) times daily as needed for Cough  citalopram (CELEXA) 20 MG tablet Take 1 tablet (20 mg total) by mouth once daily 90 tablet 3  cyanocobalamin, vitamin B-12, (LIQUID B-12 ORAL) Take by mouth 3 times a week  hydroCHLOROthiazide (HYDRODIURIL) 25 MG tablet Take 1 tablet (25 mg total) by mouth once daily as needed 90 tablet 3  meloxicam (MOBIC) 15 MG tablet take 1 tablet by mouth every day (Patient not taking: Reported on 07/07/2023) 30 tablet 0  simvastatin (ZOCOR) 20 MG tablet Take 1 tablet (20 mg total) by mouth once daily 90 tablet 3  valACYclovir (VALTREX) 1000 MG tablet Take 1,000 mg by mouth 2 (two) times daily as needed.  VITAMIN D3 ORAL Take 1 capsule by mouth once daily   Allergies: No Known Allergies   Review of Systems:  A comprehensive 14 point ROS was performed, reviewed by me today, and the pertinent orthopaedic findings are documented in the HPI.  Physical Exam: There were no vitals taken for this visit. General/Constitutional: The patient appears to be well-nourished, well-developed, and in no acute distress. Neuro/Psych: Normal mood and affect, oriented to person, place and time. Eyes: Non-icteric. Pupils are equal, round, and reactive to light, and exhibit synchronous movement. ENT: Unremarkable. Lymphatic: No palpable adenopathy. Respiratory: Lungs clear to auscultation, Normal chest excursion, No wheezes, and Non-labored breathing Cardiovascular: Regular rate and rhythm. No murmurs. and No edema, swelling or tenderness, except as noted in detailed exam. Integumentary: No impressive skin lesions present, except as noted in detailed exam. Musculoskeletal: Unremarkable, except as noted in detailed  exam.  General: Well developed, well nourished 62 y.o. female in no apparent distress. Normal affect. Normal communication. Patient answers  questions appropriately. The patient has a moderate limp favoring the right leg.   Right Lower Extremities: Examination of the right lower extremity reveals no bony abnormality, no edema, mild effusion and no ecchymosis. There is a mild varus abnormality. The patient is mildly tender along the lateral joint line, and is moderately tender along the medial joint line. The patient does have full knee extension, with knee extension the patient does have moderate patellofemoral crepitus. Full knee flexion with increased discomfort. The patient has a positive rotational Mcmurray test. There is mild-moderate retropatellar discomfort. The patient has a negative patella stretch test. The patient has a negative varus stress test and a negative valgus stress test, in looking for stability. The patient has a negative Lachman's test.  Vascular: The patient has a negative Denna Haggard' test bilaterally. The patient had a normal dorsalis pedis and posterior tibial pulse. There is normal skin warmth. There is normal capillary refill bilaterally.   Neurologic: The patient has a negative straight leg raise. The patient has normal muscle strength testing for the quadriceps, calves, ankle dorsiflexion, ankle plantarflexion, and extensor hallicus longus. The patient has sensation that is intact to light touch. The deep tendon reflexes are normal at the patella and achilles. No clonus is noted.   Imaging: AP, lateral and sunrise views of the right knee were obtained previously in the office and reviewed by me today.. These x-rays do demonstrate underlying right knee osteoarthritic changes with subchondral sclerosis noted along the medial compartment with slight loss of medial compartment joint space as well. There does appear to be a osteophyte off the superior aspect of the patella. The patellofemoral joint space does appear to be narrow at this time. No acute fracture or evidence of dislocation.  PA flexion views were in the  past and reviewed at today's visit. These x-rays do not demonstrate any worsening osteoarthritic changes involving the knee at this time. No acute fractures identified. No lytic lesions noted.  MRI OF THE RIGHT KNEE WITHOUT CONTRAST:  1. Small oblique tear of the lateral meniscus anterior horn.  2. Mild-to-moderate medial and patellofemoral compartment  osteoarthritis.   Impression: 1. Primary osteoarthritis of right knee. 2. Tear of medial meniscus of right knee.  Plan:  1. Treatment options were discussed today with the patient. 2. The patient is scheduled for a right total knee arthroplasty with Dr. Joice Lofts on 08/16/2023. 3. The patient was instructed on the risk and benefits of surgical intervention and wishes to proceed at this time. 4. This document will serve as a surgical history and physical for the patient. 5. The patient will follow-up per standard postop protocol. She can contact the clinic if she has any questions, new symptoms develop or symptoms worsen.  The procedure was discussed with the patient, as were the potential risks (including bleeding, infection, nerve and/or blood vessel injury, persistent or recurrent pain, failure of the hardware, stiffness, knee instability, need for further surgery, blood clots, strokes, heart attacks and/or arhythmias, pneumonia, etc.) and benefits. The patient states her understanding and wishes to proceed.    H&P reviewed and patient re-examined. No changes.

## 2023-08-16 NOTE — Discharge Instructions (Addendum)
Orthopedic discharge instructions: May shower with intact OpSite dressing. Apply ice frequently to knee or use Polar Care. Start Eliquis 1 tablet (2.5 mg) twice daily on Wednesday, 08/16/2022, for 2 weeks, then take aspirin 325 mg twice daily for 4 weeks. Take pain medication as prescribed when needed.  May supplement with ES Tylenol if necessary. May weight-bear as tolerated on right leg - use walker for balance and support. Follow-up in 10-14 days or as scheduled.  Information for Discharge Teaching: EXPAREL (bupivacaine liposome injectable suspension)   Pain relief is important to your recovery. The goal is to control your pain so you can move easier and return to your normal activities as soon as possible after your procedure. Your physician may use several types of medicines to manage pain, swelling, and more.  Your surgeon or anesthesiologist gave you EXPAREL(bupivacaine) to help control your pain after surgery.  EXPAREL is a local anesthetic designed to release slowly over an extended period of time to provide pain relief by numbing the tissue around the surgical site. EXPAREL is designed to release pain medication over time and can control pain for up to 72 hours. Depending on how you respond to EXPAREL, you may require less pain medication during your recovery. EXPAREL can help reduce or eliminate the need for opioids during the first few days after surgery when pain relief is needed the most. EXPAREL is not an opioid and is not addictive. It does not cause sleepiness or sedation.   Important! A teal colored band has been placed on your arm with the date, time and amount of EXPAREL you have received. Please leave this armband in place for the full 96 hours following administration, and then you may remove the band. If you return to the hospital for any reason within 96 hours following the administration of EXPAREL, the armband provides important information that your health care  providers to know, and alerts them that you have received this anesthetic.    Possible side effects of EXPAREL: Temporary loss of sensation or ability to move in the area where medication was injected. Nausea, vomiting, constipation Rarely, numbness and tingling in your mouth or lips, lightheadedness, or anxiety may occur. Call your doctor right away if you think you may be experiencing any of these sensations, or if you have other questions regarding possible side effects.  Follow all other discharge instructions given to you by your surgeon or nurse. Eat a healthy diet and drink plenty of water or other fluids.  POLAR CARE INFORMATION  MassAdvertisement.it  How to use Breg Polar Care [facility]?  YouTube   ShippingScam.co.uk  OPERATING INSTRUCTIONS  Start the product With dry hands, connect the transformer to the electrical connection located on the top of the cooler. Next, plug the transformer into an appropriate electrical outlet. The unit will automatically start running at this point.  To stop the pump, disconnect electrical power.  Unplug to stop the product when not in use. Unplugging the Polar Care unit turns it off. Always unplug immediately after use. Never leave it plugged in while unattended. Remove pad.    FIRST ADD WATER TO FILL LINE, THEN ICE---Replace ice when existing ice is almost melted  1 Discuss Treatment with your Licensed Health Care Practitioner and Use Only as Prescribed 2 Apply Insulation Barrier & Cold Therapy Pad 3 Check for Moisture 4 Inspect Skin Regularly  Tips and Trouble Shooting Usage Tips 1. Use cubed or chunked ice for optimal performance. 2. It is  recommended to drain the Pad between uses. To drain the pad, hold the Pad upright with the hose pointed toward the ground. Depress the black plunger and allow water to drain out. 3. You may disconnect the Pad from the unit without removing the pad from the affected  area by depressing the silver tabs on the hose coupling and gently pulling the hoses apart. The Pad and unit will seal itself and will not leak. Note: Some dripping during release is normal. 4. DO NOT RUN PUMP WITHOUT WATER! The pump in this unit is designed to run with water. Running the unit without water will cause permanent damage to the pump. 5. Unplug unit before removing lid.  TROUBLESHOOTING GUIDE Pump not running, Water not flowing to the pad, Pad is not getting cold 1. Make sure the transformer is plugged into the wall outlet. 2. Confirm that the ice and water are filled to the indicated levels. 3. Make sure there are no kinks in the pad. 4. Gently pull on the blue tube to make sure the tube/pad junction is straight. 5. Remove the pad from the treatment site and ll it while the pad is lying at; then reapply. 6. Confirm that the pad couplings are securely attached to the unit. Listen for the double clicks (Figure 1) to confirm the pad couplings are securely attached.  Leaks    Note: Some condensation on the lines, controller, and pads is unavoidable, especially in warmer climates. 1. If using a Breg Polar Care Cold Therapy unit with a detachable Cold Therapy Pad, and a leak exists (other than condensation on the lines) disconnect the pad couplings. Make sure the silver tabs on the couplings are depressed before reconnecting the pad to the pump hose; then confirm both sides of the coupling are properly clicked in. 2. If the coupling continues to leak or a leak is detected in the pad itself, stop using it and call Breg Customer Care at [phone].  Cleaning After use, empty and dry the unit with a soft cloth. Warm water and mild detergent may be used occasionally to clean the pump and tubes.  WARNING: The Polar Care Cube can be cold enough to cause serious injury, including full skin necrosis. Follow these Operating Instructions, and carefully read the Product Insert (see pouch on side  of unit) and the Cold Therapy Pad Fitting Instructions (provided with each Cold Therapy Pad) prior to use.

## 2023-08-16 NOTE — Evaluation (Signed)
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 PMH includes depression, GERD, HLD, L TKA (2019), PONV, and prior cancer.  Clinical Impression  Pt was pleasant and motivated to participate during the session and put forth good effort throughout. Pt was A,O x4 with spouse present throughout session. Pt was able to perform bed mobility with Mod I and extra time. Pt req CGA and cuing for transfers for safety and efficiency. Pt was able to amb with CGA and RW in room and tolerated mobility well. Pt's vitals were monitored throughout session, remained WNL. Pt reported no increase in pain or adverse symptoms throughout session. Pt will benefit from continued PT services upon discharge to safely address deficits listed in patient problem list for decreased caregiver assistance and eventual return to PLOF.      If plan is discharge home, recommend the following: A little help with walking and/or transfers;A little help with bathing/dressing/bathroom;Help with stairs or ramp for entrance;Assist for transportation;Assistance with cooking/housework   Can travel by private vehicle        Equipment Recommendations BSC/3in1  Recommendations for Other Services       Functional Status Assessment Patient has had a recent decline in their functional status and demonstrates the ability to make significant improvements in function in a reasonable and predictable amount of time.     Precautions / Restrictions Precautions Precautions: Knee;Fall Precaution Booklet Issued: Yes (comment) Restrictions Weight Bearing Restrictions: Yes RLE Weight Bearing: Weight bearing as tolerated      Mobility  Bed Mobility Overal bed mobility: Needs Assistance Bed Mobility: Supine to Sit     Supine to sit: Used rails, HOB elevated, Modified independent (Device/Increase  time)     General bed mobility comments: good R LE management, extra time req    Transfers Overall transfer level: Needs assistance Equipment used: Rolling walker (2 wheels) Transfers: Sit to/from Stand Sit to Stand: Contact guard assist           General transfer comment: able to complete with fluid movement, no notable unsteadiness, cuing for hand and R LE placement    Ambulation/Gait Ambulation/Gait assistance: Contact guard assist Gait Distance (Feet): 12 Feet Assistive device: Rolling walker (2 wheels) Gait Pattern/deviations: Decreased step length - right, Decreased step length - left, Decreased stride length, Step-to pattern, Decreased stance time - right Gait velocity: decreased     General Gait Details: able to complete forward/backward/sideways steps in room, able to bear weight well through R LE, min BUE reliance on RW, cuing for sequencing and RW management  Stairs            Wheelchair Mobility     Tilt Bed    Modified Rankin (Stroke Patients Only)       Balance Overall balance assessment: Needs assistance Sitting-balance support: Feet supported, Single extremity supported Sitting balance-Leahy Scale: Good     Standing balance support: Bilateral upper extremity supported, During functional activity Standing balance-Leahy Scale: Fair Standing balance comment: able to weight shift med/lat with min-mod BUE WBing on RW, steady in static standing with RW                             Pertinent Vitals/Pain Pain Assessment Pain Assessment: No/denies pain Pain Intervention(s): Monitored during session, Ice applied, Repositioned  Home Living Family/patient expects to be discharged to:: Private residence Living Arrangements: Spouse/significant other;Children Available Help at Discharge: Family;Available 24 hours/day Type of Home: House Home Access: Stairs to enter Entrance Stairs-Rails:  (both, cannot reach simultaneously) Entrance  Stairs-Number of Steps: 4   Home Layout: One level Home Equipment: Agricultural consultant (2 wheels);Cane - quad;Grab bars - tub/shower      Prior Function Prior Level of Function : Independent/Modified Independent             Mobility Comments: ind as Tourist information centre manager with no AD and no fall history ADLs Comments: ind with all ADLs     Extremity/Trunk Assessment   Upper Extremity Assessment Upper Extremity Assessment: Overall WFL for tasks assessed    Lower Extremity Assessment Lower Extremity Assessment: Generalized weakness;RLE deficits/detail; BLE ankle strength, ROM, sensation to light touch grossly WNL       Communication   Communication Communication: No apparent difficulties Cueing Techniques: Verbal cues;Visual cues  Cognition Arousal: Alert Behavior During Therapy: WFL for tasks assessed/performed Overall Cognitive Status: Within Functional Limits for tasks assessed                                          General Comments      Exercises Total Joint Exercises Quad Sets: AROM, Right, 10 reps, Supine Goniometric ROM: R Knee AROM 3-92 deg General Exercises - Lower Extremity Ankle Circles/Pumps: AROM, Both, 10 reps, Supine Long Arc Quad: AROM, Right, 10 reps, Seated Other Exercises Other Exercises: educated per knee exercise booklet, educated regarding transfer sequencing for safety, RLE positioning education to promote knee extension ROM   Assessment/Plan    PT Assessment Patient needs continued PT services  PT Problem List Decreased strength;Decreased range of motion;Decreased activity tolerance;Decreased balance;Decreased mobility;Decreased knowledge of use of DME;Decreased safety awareness;Decreased coordination;Pain       PT Treatment Interventions DME instruction;Balance training;Gait training;Stair training;Functional mobility training;Patient/family education;Therapeutic activities;Therapeutic exercise    PT Goals (Current goals can  be found in the Care Plan section)  Acute Rehab PT Goals Patient Stated Goal: get back to walking normally PT Goal Formulation: With patient Time For Goal Achievement: 08/29/23 Potential to Achieve Goals: Good    Frequency BID     Co-evaluation               AM-PAC PT "6 Clicks" Mobility  Outcome Measure Help needed turning from your back to your side while in a flat bed without using bedrails?: None Help needed moving from lying on your back to sitting on the side of a flat bed without using bedrails?: A Little Help needed moving to and from a bed to a chair (including a wheelchair)?: A Little Help needed standing up from a chair using your arms (e.g., wheelchair or bedside chair)?: None Help needed to walk in hospital room?: A Little Help needed climbing 3-5 steps with a railing? : A Little 6 Click Score: 20    End of Session Equipment Utilized During Treatment: Gait belt Activity Tolerance: Patient tolerated treatment well Patient left: in chair;with call bell/phone within reach;with family/visitor present Nurse Communication: Mobility status (chair alarm box not present in room) PT Visit Diagnosis: Other abnormalities of gait and mobility (R26.89);Muscle weakness (generalized) (M62.81);Pain Pain - Right/Left: Right Pain - part of body: Knee    Time: 4098-1191 PT Time Calculation (min) (ACUTE ONLY): 30 min   Charges:  Rosiland Oz SPT 08/16/23, 5:25 PM

## 2023-08-16 NOTE — Transfer of Care (Signed)
Immediate Anesthesia Transfer of Care Note  Patient: KRISTEN FRANGELLA  Procedure(s) Performed: TOTAL KNEE ARTHROPLASTY (Right: Knee)  Patient Location: PACU  Anesthesia Type:General  Level of Consciousness: awake, alert , and oriented  Airway & Oxygen Therapy: Patient Spontanous Breathing and Patient connected to face mask oxygen  Post-op Assessment: Report given to RN and Post -op Vital signs reviewed and stable  Post vital signs: Reviewed and stable  Last Vitals:  Vitals Value Taken Time  BP 114/47 08/16/23 1300  Temp 36.1 1301  Pulse 86 08/16/23 1302  Resp 14 08/16/23 1302  SpO2 100 % 08/16/23 1302  Vitals shown include unfiled device data.  Last Pain:  Vitals:   08/16/23 0906  TempSrc: Temporal  PainSc: 2          Complications: No notable events documented.

## 2023-08-16 NOTE — Anesthesia Preprocedure Evaluation (Signed)
Anesthesia Evaluation  Patient identified by MRN, date of birth, ID band Patient awake    Reviewed: Allergy & Precautions, NPO status , Patient's Chart, lab work & pertinent test results  History of Anesthesia Complications (+) PONV and history of anesthetic complications  Airway Mallampati: II  TM Distance: >3 FB Neck ROM: Full    Dental no notable dental hx. (+) Teeth Intact   Pulmonary sleep apnea and Continuous Positive Airway Pressure Ventilation , neg COPD, Patient abstained from smoking.Not current smoker, former smoker   Pulmonary exam normal breath sounds clear to auscultation       Cardiovascular Exercise Tolerance: Good METS(-) hypertension(-) CAD and (-) Past MI negative cardio ROS (-) dysrhythmias  Rhythm:Regular Rate:Normal - Systolic murmurs    Neuro/Psych  PSYCHIATRIC DISORDERS  Depression    negative neurological ROS     GI/Hepatic ,neg GERD  ,,(+)     (-) substance abuse  Denies GERD   Endo/Other  neg diabetes    Renal/GU negative Renal ROS     Musculoskeletal  (+) Arthritis ,    Abdominal  (+) + obese  Peds  Hematology   Anesthesia Other Findings Past Medical History: No date: Arthritis No date: Cancer (HCC)     Comment:  skin No date: Depression No date: GERD (gastroesophageal reflux disease) No date: Hyperlipidemia No date: Mood changes No date: Multiple thyroid nodules No date: PONV (postoperative nausea and vomiting)     Comment:  after Hysterectomy  No date: Sleep apnea No date: Wears hearing aid in both ears  Reproductive/Obstetrics                             Anesthesia Physical Anesthesia Plan  ASA: 2  Anesthesia Plan: Spinal   Post-op Pain Management: Ofirmev IV (intra-op)*   Induction: Intravenous  PONV Risk Score and Plan: 3 and Ondansetron, Dexamethasone, Propofol infusion, TIVA and Midazolam  Airway Management Planned: Natural Airway  and Nasal CPAP  Additional Equipment: None  Intra-op Plan:   Post-operative Plan:   Informed Consent: I have reviewed the patients History and Physical, chart, labs and discussed the procedure including the risks, benefits and alternatives for the proposed anesthesia with the patient or authorized representative who has indicated his/her understanding and acceptance.       Plan Discussed with: CRNA and Surgeon  Anesthesia Plan Comments: (Discussed R/B/A of neuraxial anesthesia technique with patient: - rare risks of spinal/epidural hematoma, nerve damage, infection - Risk of PDPH - Risk of nausea and vomiting - Risk of conversion to general anesthesia and its associated risks, including sore throat, damage to lips/eyes/teeth/oropharynx, and rare risks such as cardiac and respiratory events. - Risk of allergic reactions  Discussed the role of CRNA in patient's perioperative care.  Patient voiced understanding.)        Anesthesia Quick Evaluation

## 2023-08-16 NOTE — Progress Notes (Signed)
Patient is not able to walk the distance required to go the bathroom, or he/she is unable to safely negotiate stairs required to access the bathroom.  A 3in1 BSC will alleviate this problem  

## 2023-08-16 NOTE — Progress Notes (Signed)
Patient is doing very well, notified patient spouse (Will) that she is doing well.

## 2023-08-17 ENCOUNTER — Encounter: Payer: Self-pay | Admitting: Surgery

## 2023-08-17 DIAGNOSIS — M1711 Unilateral primary osteoarthritis, right knee: Secondary | ICD-10-CM | POA: Diagnosis not present

## 2023-08-17 MED ORDER — DEXTROSE-SODIUM CHLORIDE 5-0.9 % IV SOLN: INTRAVENOUS | Status: DC

## 2023-08-17 NOTE — Progress Notes (Signed)
  Subjective: 1 Day Post-Op Procedure(s) (LRB): TOTAL KNEE ARTHROPLASTY (Right) Patient reports pain as mild.   Patient is well, and has had no acute complaints or problems Plan is to go Home after hospital stay. Negative for chest pain and shortness of breath Fever: no Gastrointestinal:Negative for nausea and vomiting Patient is passing gas this morning without pain.  Objective: Vital signs in last 24 hours: Temp:  [97.1 F (36.2 C)-98 F (36.7 C)] 98 F (36.7 C) (11/13 0317) Pulse Rate:  [61-89] 67 (11/13 0317) Resp:  [14-20] 18 (11/13 0317) BP: (108-138)/(47-60) 134/56 (11/13 0317) SpO2:  [94 %-100 %] 96 % (11/13 0317) Weight:  [105 kg] 105 kg (11/12 2244)  Intake/Output from previous day:  Intake/Output Summary (Last 24 hours) at 08/17/2023 0726 Last data filed at 08/16/2023 2335 Gross per 24 hour  Intake 1150 ml  Output 905 ml  Net 245 ml    Intake/Output this shift: No intake/output data recorded.  Labs: No results for input(s): "HGB" in the last 72 hours. No results for input(s): "WBC", "RBC", "HCT", "PLT" in the last 72 hours. No results for input(s): "NA", "K", "CL", "CO2", "BUN", "CREATININE", "GLUCOSE", "CALCIUM" in the last 72 hours. No results for input(s): "LABPT", "INR" in the last 72 hours.   EXAM General - Patient is Alert, Appropriate, and Oriented Extremity - ABD soft Neurovascular intact Dorsiflexion/Plantar flexion intact Incision: dressing C/D/I No cellulitis present Compartment soft She is able to perform a straight leg raise without assistance. Dressing/Incision - clean, dry, no drainage noted to the right leg honeycomb dressing. Motor Function - intact, moving foot and toes well on exam.  Abdomen soft with intact bowel sounds. Negative homans.  Past Medical History:  Diagnosis Date   Arthritis    Cancer (HCC)    skin   Depression    GERD (gastroesophageal reflux disease)    Hyperlipidemia    Mood changes    Multiple thyroid  nodules    PONV (postoperative nausea and vomiting)    after Hysterectomy    Sleep apnea    Wears hearing aid in both ears     Assessment/Plan: 1 Day Post-Op Procedure(s) (LRB): TOTAL KNEE ARTHROPLASTY (Right) Principal Problem:   Status post total knee replacement using cement, right  Estimated body mass index is 39.73 kg/m as calculated from the following:   Height as of this encounter: 5\' 4"  (1.626 m).   Weight as of this encounter: 105 kg. Advance diet Up with therapy D/C IV fluids when tolerating po intake.  Vitals reviewed this AM, BP 134/56. Up with therapy today. Continue to work on BM. Plan for discharge home today pending progress with PT.  DVT Prophylaxis - TED hose and Eliquis Weight-Bearing as tolerated to right leg  J. Horris Latino, PA-C [facility] 08/17/2023, 7:26 AM

## 2023-08-17 NOTE — Anesthesia Postprocedure Evaluation (Signed)
Anesthesia Post Note  Patient: Isabella Luna  Procedure(s) Performed: TOTAL KNEE ARTHROPLASTY (Right: Knee)  Patient location during evaluation: Nursing Unit Anesthesia Type: Spinal Level of consciousness: awake Pain management: pain level controlled Respiratory status: spontaneous breathing Cardiovascular status: stable Postop Assessment: no headache Anesthetic complications: no   No notable events documented.   Last Vitals:  Vitals:   08/16/23 2325 08/17/23 0317  BP: (!) 119/53 (!) 134/56  Pulse: 70 67  Resp: 18 18  Temp: 36.7 C 36.7 C  SpO2: 94% 96%    Last Pain:  Vitals:   08/17/23 0600  TempSrc:   PainSc: 0-No pain                 Jaye Beagle

## 2023-08-17 NOTE — Plan of Care (Signed)
  Problem: Education: Goal: Knowledge of General Education information will improve Description: Including pain rating scale, medication(s)/side effects and non-pharmacologic comfort measures Outcome: Progressing   Problem: Health Behavior/Discharge Planning: Goal: Ability to manage health-related needs will improve Outcome: Progressing   Problem: Clinical Measurements: Goal: Ability to maintain clinical measurements within normal limits will improve Outcome: Progressing Goal: Will remain free from infection Outcome: Progressing   Problem: Elimination: Goal: Will not experience complications related to bowel motility Outcome: Progressing   Problem: Pain Management: Goal: General experience of comfort will improve Outcome: Progressing

## 2023-08-17 NOTE — Progress Notes (Signed)
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 PMH includes depression, GERD, HLD, L TKA (2019), PONV, and prior cancer.    PT Comments  Pt was pleasant and motivated to participate during the session and put forth good effort throughout. Pt required no physical assistance during the session and made excellent progress towards all goals.  Pt was steady with no overt LOB with transfers, gait, and stair training and demonstrated good carryover with sequencing for all functional tasks.  Pt reported no adverse symptoms during the session with SpO2 and HR WNL on room air.   Pt will benefit from continued PT services upon discharge to safely address deficits listed in patient problem list for decreased caregiver assistance and eventual return to PLOF.      If plan is discharge home, recommend the following: A little help with walking and/or transfers;A little help with bathing/dressing/bathroom;Help with stairs or ramp for entrance;Assist for transportation;Assistance with cooking/housework   Can travel by private vehicle        Equipment Recommendations  BSC/3in1    Recommendations for Other Services       Precautions / Restrictions Precautions Precautions: Knee;Fall Restrictions Weight Bearing Restrictions: Yes RLE Weight Bearing: Weight bearing as tolerated     Mobility  Bed Mobility Overal bed mobility: Needs Assistance       Supine to sit: Modified independent (Device/Increase time)     General bed mobility comments: Min extra time and effort only    Transfers Overall transfer level: Needs assistance Equipment used: Rolling walker (2 wheels) Transfers: Sit to/from Stand Sit to Stand: Supervision           General transfer comment: Min verbal cues for sequencing with good control and stability throughout     Ambulation/Gait Ambulation/Gait assistance: Contact guard assist Gait Distance (Feet): 150 Feet Assistive device: Rolling walker (2 wheels) Gait Pattern/deviations: Decreased step length - left, Step-to pattern, Decreased stance time - right, Step-through pattern Gait velocity: decreased     General Gait Details: Step-to pattern that progressed quickly to step-through with only minimal decrease in L step length compared to R   Stairs Stairs: Yes Stairs assistance: Contact guard assist Stair Management: Step to pattern, Forwards, One rail Right Number of Stairs: 4 General stair comments: Verbal and visual cues for sequencing with pt demonstrating very good carryover, good eccentric and concentric control and stability; spouse present for training    Wheelchair Mobility     Tilt Bed    Modified Rankin (Stroke Patients Only)       Balance Overall balance assessment: Needs assistance   Sitting balance-Leahy Scale: Normal     Standing balance support: Bilateral upper extremity supported, During functional activity Standing balance-Leahy Scale: Good                              Cognition Arousal: Alert Behavior During Therapy: WFL for tasks assessed/performed Overall Cognitive Status: Within Functional Limits for tasks assessed                                          Exercises Total Joint Exercises Quad Sets: AROM, Right, 10 reps, Supine Long Arc Quad: AROM, Strengthening, Right,  10 reps Knee Flexion: AROM, Strengthening, Right, 10 reps Goniometric ROM: R knee AROM: 10-90 deg Marching in Standing: AROM, Strengthening, Both, 5 reps, Standing Other Exercises Other Exercises: Car transfer sequencing education with pt and spouse Other Exercises: RLE positioning education to promote knee ext PROM Other Exercises: HEP education/review per handout    General Comments        Pertinent Vitals/Pain Pain Assessment Pain Assessment:  No/denies pain    Home Living                          Prior Function            PT Goals (current goals can now be found in the care plan section) Progress towards PT goals: Progressing toward goals    Frequency    BID      PT Plan      Co-evaluation              AM-PAC PT "6 Clicks" Mobility   Outcome Measure  Help needed turning from your back to your side while in a flat bed without using bedrails?: None Help needed moving from lying on your back to sitting on the side of a flat bed without using bedrails?: A Little Help needed moving to and from a bed to a chair (including a wheelchair)?: A Little Help needed standing up from a chair using your arms (e.g., wheelchair or bedside chair)?: A Little Help needed to walk in hospital room?: A Little Help needed climbing 3-5 steps with a railing? : A Little 6 Click Score: 19    End of Session Equipment Utilized During Treatment: Gait belt Activity Tolerance: Patient tolerated treatment well Patient left: in chair;with call bell/phone within reach;with family/visitor present;with SCD's reapplied;Other (comment) (polar care donned to R knee) Nurse Communication: Mobility status (No green box to plug chair alarm into) PT Visit Diagnosis: Other abnormalities of gait and mobility (R26.89);Muscle weakness (generalized) (M62.81);Pain Pain - Right/Left: Right Pain - part of body: Knee     Time: 0913-0949 PT Time Calculation (min) (ACUTE ONLY): 36 min  Charges:    $Gait Training: 8-22 mins $Therapeutic Activity: 8-22 mins PT General Charges $$ ACUTE PT VISIT: 1 Visit                     D. Scott Aminta Sakurai PT, DPT 08/17/23, 10:14 AM

## 2023-08-17 NOTE — Discharge Summary (Signed)
Physician Discharge Summary  Patient ID: Isabella Luna MRN: [id number] DOB/AGE: July 08, 1961 62 y.o.  Admit date: 08/16/2023 Discharge date: 08/17/2023  Admission Diagnoses:  Status post total knee replacement using cement, right [Z96.651] Right knee degenerative joint disease  Discharge Diagnoses: Patient Active Problem List   Diagnosis Date Noted   Status post total knee replacement using cement, right 08/16/2023   TIA (transient ischemic attack) 04/09/2021   Status post total knee replacement using cement, left 03/07/2018    Past Medical History:  Diagnosis Date   Arthritis    Cancer (HCC)    skin   Depression    GERD (gastroesophageal reflux disease)    Hyperlipidemia    Mood changes    Multiple thyroid nodules    PONV (postoperative nausea and vomiting)    after Hysterectomy    Sleep apnea    Wears hearing aid in both ears      Transfusion: None.   Consultants (if any):   Discharged Condition: Improved  Hospital Course: Isabella Luna is an 62 y.o. female who was admitted 08/16/2023 with a diagnosis of right knee degenerative joint disease and went to the operating room on 08/16/2023 and underwent the above named procedures.    Surgeries: Procedure(s): TOTAL KNEE ARTHROPLASTY on 08/16/2023 Patient tolerated the surgery well. Taken to PACU where she was stabilized and then transferred to the orthopedic floor.  Started on Eliquis 2.5mg  every 12 hours. Heels elevated on bed with rolled towels. No evidence of DVT. Negative Homan. Physical therapy started on day #0 for gait training and transfer. OT started day #1 for ADL and assisted devices.  Patient's IV was removed on POD1.  Implants: Right TKA using all-cemented Zimmer Persona system with a #8 narrow PCR femur, a(n) D-sized  tibial tray with a 10 mm medial congruent E-poly insert, and a 28 x 6.2 mm Vanguard all-poly 3-pegged domed patella.   She was given perioperative antibiotics:  Anti-infectives (From  admission, onward)    Start     Dose/Rate Route Frequency Ordered Stop   08/16/23 1845  ceFAZolin (ANCEF) IVPB 2g/100 mL premix        2 g 200 mL/hr over 30 Minutes Intravenous Every 6 hours 08/16/23 1756 08/17/23 0630   08/16/23 0900  ceFAZolin (ANCEF) IVPB 2g/100 mL premix        2 g 200 mL/hr over 30 Minutes Intravenous On call to O.R. 08/16/23 0847 08/16/23 1105     .  She was given sequential compression devices, early ambulation, and Eliquis for DVT prophylaxis.  She benefited maximally from the hospital stay and there were no complications.    Recent vital signs:  Vitals:   08/16/23 2325 08/17/23 0317  BP: (!) 119/53 (!) 134/56  Pulse: 70 67  Resp: 18 18  Temp: 98 F (36.7 C) 98 F (36.7 C)  SpO2: 94% 96%    Recent laboratory studies:  Lab Results  Component Value Date   HGB 15.8 (H) 08/10/2023   HGB 16.2 (H) 04/09/2021   HGB 13.9 03/09/2018   Lab Results  Component Value Date   WBC 6.1 08/10/2023   PLT 231 08/10/2023   Lab Results  Component Value Date   INR 0.9 04/09/2021   Lab Results  Component Value Date   NA 138 08/10/2023   K 3.8 08/10/2023   CL 103 08/10/2023   CO2 26 08/10/2023   BUN 12 08/10/2023   CREATININE 0.61 08/10/2023   GLUCOSE 108 (H) 08/10/2023  Discharge Medications:   Allergies as of 08/17/2023       Reactions   Cortisone Other (See Comments)   Facial redness, insomnia Tolerated shot 03/2023 in right knee.         Medication List     STOP taking these medications    naproxen sodium 220 MG tablet Commonly known as: ALEVE       TAKE these medications    apixaban 2.5 MG Tabs tablet Commonly known as: Eliquis Take 1 tablet (2.5 mg total) by mouth 2 (two) times daily.   citalopram 20 MG tablet Commonly known as: CELEXA Take 1 tablet (20 mg total) by mouth daily.   ondansetron 4 MG disintegrating tablet Commonly known as: ZOFRAN-ODT Take 1 tablet (4 mg total) by mouth every 8 (eight) hours as needed for  nausea or vomiting.   oxyCODONE 5 MG immediate release tablet Commonly known as: Roxicodone Take 1-2 tablets (5-10 mg total) by mouth every 4 (four) hours as needed for moderate pain (pain score 4-6) or severe pain (pain score 7-10).   simvastatin 20 MG tablet Commonly known as: ZOCOR Take 1 tablet (20 mg total) by mouth daily. What changed: when to take this   valACYclovir 1000 MG tablet Commonly known as: VALTREX Take 1,000 mg by mouth 2 (two) times daily as needed (for cold sores).   VITAMIN D PO Take 1,000 Units by mouth in the morning.               Durable Medical Equipment  (From admission, onward)           Start     Ordered   08/16/23 1757  DME Bedside commode  Once       Question:  Patient needs a bedside commode to treat with the following condition  Answer:  Status post total knee replacement using cement, right   08/16/23 1756   08/16/23 1757  DME 3 n 1  Once        08/16/23 1756   08/16/23 1757  DME Walker rolling  Once       Question Answer Comment  Walker: With 5 Inch Wheels   Patient needs a walker to treat with the following condition Status post total knee replacement using cement, right      08/16/23 1756   08/16/23 1450  For home use only DME Walker rolling  Once       Question Answer Comment  Walker: With 5 Inch Wheels   Patient needs a walker to treat with the following condition Impaired mobility      08/16/23 1449   08/16/23 1450  For home use only DME Bedside commode  Once       Question:  Patient needs a bedside commode to treat with the following condition  Answer:  Impaired mobility   08/16/23 1449            Diagnostic Studies: DG Knee Right Port  Result Date: 08/16/2023 CLINICAL DATA:  Total knee replacement. EXAM: PORTABLE RIGHT KNEE - 1-2 VIEW COMPARISON:  None Available. FINDINGS: Right knee arthroplasty in expected alignment. No periprosthetic lucency or fracture. There has been patellar resurfacing. Recent  postsurgical change includes air and edema in the soft tissues and joint space. Anterior skin staples in place. IMPRESSION: Right knee arthroplasty without immediate postoperative complication. Electronically Signed   By: Narda Rutherford M.D.   On: 08/16/2023 16:45    Disposition: Plan for discharge home today pending progress with PT.  Follow-up Information     Anson [place], PA-C Follow up in 14 day(s).   Specialty: Physician Assistant Why: Wenda Low removal. Contact information: [address] [place] [address] [phone]                  Signed: Meriel Pica PA-C 08/17/2023, 7:29 AM

## 2023-08-17 NOTE — TOC Initial Note (Signed)
Transition of Care [facility]) - Initial/Assessment Note    Patient Details  Name: Isabella Luna MRN: [id number] Date of Birth: Nov 17, 1960  [facility]) CM/SW Contact:    Marlowe Sax, RN Phone Number: 08/17/2023, 8:58 AM  Clinical Narrative:                  The patient lives at home with her spouse, she has a RW and [employer] will deliver a 3 in 1 to the bedside, she is set up with [facility] for [facility] services prior to surgery by surgeons office Expected Discharge Plan: Home w Home Health Services Barriers to Discharge: No Barriers Identified   Patient Goals and CMS Choice            Expected Discharge Plan and Services   Discharge Planning Services: CM Consult   Living arrangements for the past 2 months: Single Family Home Expected Discharge Date: 08/17/23               DME Arranged: 3-N-1 DME Agency: [employer] Date DME Agency Contacted: 08/17/23 Time DME Agency Contacted: [phone] Representative spoke with at DME Agency: Cletis [place] HH Arranged: PT, OT HH Agency: [facility] Date [facility] Agency Contacted: 08/17/23 Time HH Agency Contacted: [phone] Representative spoke with at [facility] Agency: [place]  Prior Living Arrangements/Services Living arrangements for the past 2 months: Single Family Home Lives with:: Spouse Patient language and need for interpreter reviewed:: Yes Do you feel safe going back to the place where you live?: Yes      Need for Family Participation in Patient Care: Yes (Comment) Care giver support system in place?: Yes (comment) Current home services: DME (Rolling Walker (2 wheels);Cane - quad;Grab bars - tub/shower) Criminal Activity/Legal Involvement Pertinent to Current Situation/Hospitalization: No - Comment as needed  Activities of Daily Living   ADL Screening (condition at time of admission) Independently performs ADLs?: Yes (appropriate for developmental age) Is the patient deaf or have difficulty hearing?: No Does the patient have  difficulty seeing, even when wearing glasses/contacts?: No Does the patient have difficulty concentrating, remembering, or making decisions?: No  Permission Sought/Granted   Permission granted to share information with : Yes, Verbal Permission Granted              Emotional Assessment Appearance:: Appears stated age   Affect (typically observed): Pleasant Orientation: : Oriented to Self, Oriented to Place, Oriented to  Time, Oriented to Situation Alcohol / Substance Use: Not Applicable Psych Involvement: No (comment)  Admission diagnosis:  Status post total knee replacement using cement, right [Z96.651] Patient Active Problem List   Diagnosis Date Noted   Status post total knee replacement using cement, right 08/16/2023   TIA (transient ischemic attack) 04/09/2021   Status post total knee replacement using cement, left 03/07/2018   PCP:  Danella Penton, MD Pharmacy:   [facility] [address] [place] - [address] [place] [address] Phone: [phone] Fax: [phone]     Social Determinants of Health (SDOH) Social History: SDOH Screenings   Food Insecurity: No Food Insecurity (08/16/2023)  Housing: Low Risk  (08/16/2023)  Transportation Needs: No Transportation Needs (08/16/2023)  Utilities: Not At Risk (08/16/2023)  Financial Resource Strain: Low Risk  (12/28/2022)   Received from [facility], [facility]  Physical Activity: Inactive (12/28/2022)   Received from [facility], [facility]  Social Connections: Moderately Integrated (12/28/2022)   [facility] from [facility], [facility]  Stress: No Stress Concern Present (12/28/2022)   Received from [facility], [facility]  Tobacco Use: Medium Risk (08/16/2023)   SDOH Interventions:     Readmission Risk Interventions     No data to display
